# Patient Record
Sex: Female | Born: 1975 | Race: Black or African American | Hispanic: No | Smoking: Current every day smoker
Health system: Southern US, Community
[De-identification: ages and names within clinical notes are randomized; demographics above are authoritative.]

## PROBLEM LIST (undated history)

## (undated) DIAGNOSIS — F3130 Bipolar disorder, current episode depressed, mild or moderate severity, unspecified: Secondary | ICD-10-CM

## (undated) DIAGNOSIS — I10 Essential (primary) hypertension: Secondary | ICD-10-CM

## (undated) DIAGNOSIS — I890 Lymphedema, not elsewhere classified: Secondary | ICD-10-CM

## (undated) HISTORY — PX: GASTRIC BYPASS: SHX52

## (undated) HISTORY — PX: CHOLECYSTECTOMY: SHX55

---

## 2015-05-10 ENCOUNTER — Inpatient Hospital Stay (HOSPITAL_COMMUNITY)
Admission: EM | Admit: 2015-05-10 | Discharge: 2015-05-18 | DRG: 438 | Disposition: A | Discharge status: Home / Self Care | Payer: Medicaid - Out of State | Attending: Internal Medicine | Admitting: Internal Medicine

## 2015-05-10 ENCOUNTER — Encounter (HOSPITAL_COMMUNITY): Payer: Self-pay | Admitting: Emergency Medicine

## 2015-05-10 ENCOUNTER — Emergency Department (HOSPITAL_COMMUNITY): Payer: Medicaid - Out of State

## 2015-05-10 DIAGNOSIS — K859 Acute pancreatitis without necrosis or infection, unspecified: Secondary | ICD-10-CM | POA: Diagnosis present

## 2015-05-10 DIAGNOSIS — F1010 Alcohol abuse, uncomplicated: Secondary | ICD-10-CM | POA: Diagnosis present

## 2015-05-10 DIAGNOSIS — I10 Essential (primary) hypertension: Secondary | ICD-10-CM | POA: Diagnosis present

## 2015-05-10 DIAGNOSIS — F17210 Nicotine dependence, cigarettes, uncomplicated: Secondary | ICD-10-CM | POA: Diagnosis present

## 2015-05-10 DIAGNOSIS — I81 Portal vein thrombosis: Secondary | ICD-10-CM | POA: Diagnosis present

## 2015-05-10 DIAGNOSIS — Z9884 Bariatric surgery status: Secondary | ICD-10-CM

## 2015-05-10 DIAGNOSIS — F319 Bipolar disorder, unspecified: Secondary | ICD-10-CM | POA: Diagnosis present

## 2015-05-10 DIAGNOSIS — Z8249 Family history of ischemic heart disease and other diseases of the circulatory system: Secondary | ICD-10-CM

## 2015-05-10 DIAGNOSIS — K852 Alcohol induced acute pancreatitis without necrosis or infection: Secondary | ICD-10-CM | POA: Diagnosis present

## 2015-05-10 DIAGNOSIS — R609 Edema, unspecified: Secondary | ICD-10-CM | POA: Diagnosis not present

## 2015-05-10 DIAGNOSIS — R109 Unspecified abdominal pain: Secondary | ICD-10-CM | POA: Diagnosis present

## 2015-05-10 DIAGNOSIS — F3170 Bipolar disorder, currently in remission, most recent episode unspecified: Secondary | ICD-10-CM

## 2015-05-10 DIAGNOSIS — R1084 Generalized abdominal pain: Secondary | ICD-10-CM

## 2015-05-10 DIAGNOSIS — Z9049 Acquired absence of other specified parts of digestive tract: Secondary | ICD-10-CM | POA: Diagnosis not present

## 2015-05-10 DIAGNOSIS — E7439 Other disorders of intestinal carbohydrate absorption: Secondary | ICD-10-CM | POA: Diagnosis present

## 2015-05-10 DIAGNOSIS — R52 Pain, unspecified: Secondary | ICD-10-CM

## 2015-05-10 DIAGNOSIS — R739 Hyperglycemia, unspecified: Secondary | ICD-10-CM | POA: Diagnosis present

## 2015-05-10 HISTORY — DX: Lymphedema, not elsewhere classified: I89.0

## 2015-05-10 HISTORY — DX: Essential (primary) hypertension: I10

## 2015-05-10 HISTORY — DX: Bipolar disorder, current episode depressed, mild or moderate severity, unspecified: F31.30

## 2015-05-10 LAB — URINALYSIS, ROUTINE W REFLEX MICROSCOPIC
GLUCOSE, UA: NEGATIVE mg/dL
Ketones, ur: NEGATIVE mg/dL
Nitrite: NEGATIVE
PH: 5 (ref 5.0–8.0)
Protein, ur: 30 mg/dL — AB
SPECIFIC GRAVITY, URINE: 1.03 (ref 1.005–1.030)

## 2015-05-10 LAB — APTT: aPTT: 29 seconds (ref 24–37)

## 2015-05-10 LAB — COMPREHENSIVE METABOLIC PANEL
ALT: 36 U/L (ref 14–54)
AST: 62 U/L — AB (ref 15–41)
Albumin: 4.1 g/dL (ref 3.5–5.0)
Alkaline Phosphatase: 48 U/L (ref 38–126)
Anion gap: 11 (ref 5–15)
BUN: 9 mg/dL (ref 6–20)
CHLORIDE: 110 mmol/L (ref 101–111)
CO2: 20 mmol/L — AB (ref 22–32)
CREATININE: 0.64 mg/dL (ref 0.44–1.00)
Calcium: 8.5 mg/dL — ABNORMAL LOW (ref 8.9–10.3)
GFR calc Af Amer: >60 mL/min (ref >60)
GFR calc non Af Amer: >60 mL/min (ref >60)
GLUCOSE: 134 mg/dL — AB (ref 65–99)
Potassium: 3.8 mmol/L (ref 3.5–5.1)
SODIUM: 141 mmol/L (ref 135–145)
Total Bilirubin: 1.5 mg/dL — ABNORMAL HIGH (ref 0.3–1.2)
Total Protein: 7.4 g/dL (ref 6.5–8.1)

## 2015-05-10 LAB — CBC WITH DIFFERENTIAL/PLATELET
Basophils Absolute: 0 10*3/uL (ref 0.0–0.1)
Basophils Relative: 0 %
EOS ABS: 0 10*3/uL (ref 0.0–0.7)
EOS PCT: 0 %
HCT: 38 % (ref 36.0–46.0)
HEMOGLOBIN: 12.6 g/dL (ref 12.0–15.0)
LYMPHS PCT: 7 %
Lymphs Abs: 0.8 10*3/uL (ref 0.7–4.0)
MCH: 26.4 pg (ref 26.0–34.0)
MCHC: 33.2 g/dL (ref 30.0–36.0)
MCV: 79.5 fL (ref 78.0–100.0)
MONO ABS: 0.7 10*3/uL (ref 0.1–1.0)
Monocytes Relative: 6 %
NEUTROS PCT: 87 %
Neutro Abs: 9.5 10*3/uL — ABNORMAL HIGH (ref 1.7–7.7)
PLATELETS: 388 10*3/uL (ref 150–400)
RBC: 4.78 MIL/uL (ref 3.87–5.11)
RDW: 21.4 % — ABNORMAL HIGH (ref 11.5–15.5)
WBC: 11 10*3/uL — AB (ref 4.0–10.5)

## 2015-05-10 LAB — PROTIME-INR
INR: 1.28 (ref 0.00–1.49)
Prothrombin Time: 15.6 seconds — ABNORMAL HIGH (ref 11.6–15.2)

## 2015-05-10 LAB — URINE MICROSCOPIC-ADD ON

## 2015-05-10 LAB — LIPID PANEL
CHOL/HDL RATIO: 2 ratio
CHOLESTEROL: 128 mg/dL (ref 0–200)
HDL: 64 mg/dL (ref >40)
LDL Cholesterol: 40 mg/dL (ref 0–99)
TRIGLYCERIDES: 118 mg/dL (ref <150)
VLDL: 24 mg/dL (ref 0–40)

## 2015-05-10 LAB — LIPASE, BLOOD: Lipase: 801 U/L — ABNORMAL HIGH (ref 11–51)

## 2015-05-10 LAB — PREGNANCY, URINE: Preg Test, Ur: NEGATIVE

## 2015-05-10 MED ORDER — HYDROMORPHONE HCL 1 MG/ML IJ SOLN
1.0000 mg | INTRAMUSCULAR | Status: DC | PRN
  Administered 2015-05-10 – 2015-05-11 (×9): 1 mg via INTRAVENOUS
  Filled 2015-05-10 (×8): qty 1

## 2015-05-10 MED ORDER — ONDANSETRON HCL 4 MG/2ML IJ SOLN
4.0000 mg | Freq: Once | INTRAMUSCULAR | Status: AC
  Administered 2015-05-10: 4 mg via INTRAVENOUS
  Filled 2015-05-10: qty 2

## 2015-05-10 MED ORDER — HYDRALAZINE HCL 20 MG/ML IJ SOLN
5.0000 mg | INTRAMUSCULAR | Status: DC | PRN
  Administered 2015-05-11 (×2): 5 mg via INTRAVENOUS
  Filled 2015-05-10 (×2): qty 1

## 2015-05-10 MED ORDER — IOHEXOL 300 MG/ML  SOLN
50.0000 mL | Freq: Once | INTRAMUSCULAR | Status: AC | PRN
  Administered 2015-05-10: 50 mL via ORAL

## 2015-05-10 MED ORDER — SODIUM CHLORIDE 0.9 % IV BOLUS (SEPSIS)
1000.0000 mL | Freq: Once | INTRAVENOUS | Status: AC
  Administered 2015-05-10: 1000 mL via INTRAVENOUS

## 2015-05-10 MED ORDER — HEPARIN BOLUS VIA INFUSION
2500.0000 [IU] | Freq: Once | INTRAVENOUS | Status: DC
  Filled 2015-05-10: qty 2500

## 2015-05-10 MED ORDER — HYDROMORPHONE HCL 1 MG/ML IJ SOLN: 1.0000 mg | Freq: Once | INTRAMUSCULAR | Status: DC

## 2015-05-10 MED ORDER — OXYCODONE HCL 5 MG PO TABS
5.0000 mg | ORAL_TABLET | ORAL | Status: DC | PRN
  Administered 2015-05-11 – 2015-05-13 (×11): 5 mg via ORAL
  Filled 2015-05-10 (×11): qty 1

## 2015-05-10 MED ORDER — HYDROMORPHONE HCL 1 MG/ML IJ SOLN
1.0000 mg | Freq: Once | INTRAMUSCULAR | Status: AC
  Administered 2015-05-10: 1 mg via INTRAVENOUS
  Filled 2015-05-10: qty 1

## 2015-05-10 MED ORDER — ACETAMINOPHEN 650 MG RE SUPP: 650.0000 mg | Freq: Four times a day (QID) | RECTAL | Status: DC | PRN

## 2015-05-10 MED ORDER — ACETAMINOPHEN 325 MG PO TABS
650.0000 mg | ORAL_TABLET | Freq: Four times a day (QID) | ORAL | Status: DC | PRN
  Administered 2015-05-11 – 2015-05-15 (×6): 650 mg via ORAL
  Filled 2015-05-10 (×6): qty 2

## 2015-05-10 MED ORDER — IOPAMIDOL (ISOVUE-300) INJECTION 61%
100.0000 mL | Freq: Once | INTRAVENOUS | Status: AC | PRN
  Administered 2015-05-10: 100 mL via INTRAVENOUS

## 2015-05-10 MED ORDER — QUETIAPINE FUMARATE 50 MG PO TABS
75.0000 mg | ORAL_TABLET | Freq: Every day | ORAL | Status: DC
  Administered 2015-05-10 – 2015-05-17 (×8): 75 mg via ORAL
  Filled 2015-05-10 (×9): qty 1

## 2015-05-10 MED ORDER — HEPARIN (PORCINE) IN NACL 100-0.45 UNIT/ML-% IJ SOLN
1500.0000 [IU]/h | INTRAMUSCULAR | Status: DC
  Administered 2015-05-10 – 2015-05-11 (×2): 1500 [IU]/h via INTRAVENOUS
  Filled 2015-05-10 (×5): qty 250

## 2015-05-10 MED ORDER — SENNOSIDES-DOCUSATE SODIUM 8.6-50 MG PO TABS
1.0000 | ORAL_TABLET | Freq: Every evening | ORAL | Status: DC | PRN
  Administered 2015-05-11 – 2015-05-13 (×2): 1 via ORAL
  Filled 2015-05-10 (×2): qty 1

## 2015-05-10 MED ORDER — ONDANSETRON HCL 4 MG/2ML IJ SOLN
4.0000 mg | Freq: Four times a day (QID) | INTRAMUSCULAR | Status: DC | PRN
  Administered 2015-05-10: 4 mg via INTRAVENOUS
  Filled 2015-05-10: qty 2

## 2015-05-10 MED ORDER — PANTOPRAZOLE SODIUM 40 MG IV SOLR
40.0000 mg | Freq: Once | INTRAVENOUS | Status: AC
  Administered 2015-05-10: 40 mg via INTRAVENOUS
  Filled 2015-05-10: qty 40

## 2015-05-10 MED ORDER — LACTATED RINGERS IV SOLN
INTRAVENOUS | Status: DC
  Administered 2015-05-10 – 2015-05-15 (×8): via INTRAVENOUS

## 2015-05-10 MED ORDER — ONDANSETRON HCL 4 MG PO TABS: 4.0000 mg | ORAL_TABLET | Freq: Four times a day (QID) | ORAL | Status: DC | PRN

## 2015-05-10 MED ORDER — METOPROLOL TARTRATE 25 MG PO TABS
25.0000 mg | ORAL_TABLET | Freq: Two times a day (BID) | ORAL | Status: DC
  Administered 2015-05-10 – 2015-05-18 (×16): 25 mg via ORAL
  Filled 2015-05-10 (×18): qty 1

## 2015-05-10 NOTE — ED Notes (Signed)
Second attempt at calling report to floor. Secretary informed this Estate manager/land agentwriter nurse on floor is in contact room and will call back for report.

## 2015-05-10 NOTE — ED Notes (Addendum)
Attempted to call main phlebotomy to obtain lab draws. No answer from [id number], [phone], [phone] or [phone].

## 2015-05-10 NOTE — Progress Notes (Signed)
ANTICOAGULATION CONSULT NOTE - Initial Consult  Pharmacy Consult for heparin IV  Indication: Portal vein thrombosis  No Known Allergies  Patient Measurements: Height: 5\' 7"  (170.2 cm) Weight: 220 lb (99.791 kg) IBW/kg (Calculated) : 61.6 Heparin Dosing Weight: 84 kg  Vital Signs: Temp: 97.5 F (36.4 C) (03/27 1148) Temp Source: Oral (03/27 1148) BP: 156/87 mmHg (03/27 1403) Pulse Rate: 98 (03/27 1403)  Labs:  Recent Labs  05/10/15 1144  HGB 12.6  HCT 38.0  PLT 388  CREATININE 0.64    Estimated Creatinine Clearance: 113.5 mL/min (by C-G formula based on Cr of 0.64).   Medical History: Past Medical History  Diagnosis Date  . Hypertension   . Bipolar affect, depressed (HCC)   . Lymphedema     Assessment: [age] yoF presenting 3/27 with abdominal pain, found to have acute pancreatitis as well as partial portal vein thrombus.  Pharmacy to dose heparin.  PMH lymphedema, HTN, gastric bypass (2000)   Baseline INR, aPTT: wnl  Prior anticoagulation: none  Today, 05/10/2015:  CBC: wnl  No bleeding or infusion issues documented  CrCl: wnl  Goal of Therapy: Heparin level 0.3-0.7 units/ml Monitor platelets by anticoagulation protocol: Yes  Plan:  Heparin 2500 units IV bolus x 1  Heparin 1500 units/hr IV infusion  Check heparin level 6 hrs after start  Daily CBC, daily heparin level once stable  Monitor for signs of bleeding or thrombosis   Bernadene Personrew Challen Spainhour, PharmD Pager: [phone] 05/10/2015, 3:51 PM

## 2015-05-10 NOTE — ED Notes (Signed)
I attempted to get pt blood. Stick was unsuccessful. Fleet ContrasRachel in phlebotomy made aware.

## 2015-05-10 NOTE — ED Notes (Signed)
Bed: [id number] Expected date:  Expected time:  Means of arrival:  Comments: EMS- abdominal pain/nausea/Hx of gastric bypass

## 2015-05-10 NOTE — H&P (Signed)
History and PhysicalQuinlynn Lee   ZOX:[id number] DOB: 1975-02-23 DOA: 05/10/2015  Referring MD/provider: Dr. Estell Harpin PCP: No primary care provider on file.   Chief Complaint: Abdominal pain  History of Present Illness:   Molly Lee is an 40 y.o. female with a PMH of lymphedema of the lower extremities, hypertension and history of gastric bypass in 2000 and prior cholecystectomy who presents today with chief complaint of a 1-2 week history of intermittent abdominal pain that has gotten progressively worse over the past 2 days. She states that her pain has been almost constant now, but when it first started a week ago, she thought it was gas. She attempted to self treat with Tums, but had no significant relief. Her pain has increased in intensity to a 10+/10 and is described as "knifelike". She has had some associated nausea and retching, but no frank vomiting. Nothing has alleviated the pain however IV pain medicines given in the ED have eased it off some. Upon initial evaluation in the ED, her lipase was 801, and a CT of her abdomen and pelvis show moderate to severe acute pancreatitis with surrounding peripancreatic inflammatory changes and free fluid. No pseudocyst. There is also an acute partial thrombus of the main portal vein. The patient admits to binge drinking, up to a 12 pack of beer a day on the weekends. No history of alcohol withdrawal.  ROS:   Review of Systems  Constitutional: Positive for fever, chills and malaise/fatigue.  Eyes: Negative.   Respiratory: Positive for shortness of breath. Negative for cough.   Cardiovascular: Negative for chest pain.  Gastrointestinal: Positive for nausea, abdominal pain and constipation. Negative for heartburn, vomiting, diarrhea and blood in stool.  Genitourinary: Negative for dysuria.  Musculoskeletal: Positive for myalgias and joint pain.  Skin: Negative.   Neurological: Positive for dizziness and weakness. Negative  for headaches.  Endo/Heme/Allergies: Negative.   Psychiatric/Behavioral: Negative.      Past Medical History:   Past Medical History  Diagnosis Date  . Hypertension   . Bipolar affect, depressed (HCC)   . Lymphedema     Past Surgical History:   Past Surgical History  Procedure Laterality Date  . Gastric bypass      peformed year 2000  . Cesarean section      x 2  . Cholecystectomy      Social History:   Social History   Social History  . Marital Status: Legally Separated    Spouse Name: N/A  . Number of Children: 3  . Years of Education: N/A   Occupational History  . Unemployed.    Social History Main Topics  . Smoking status: Current Every Day Smoker -- 1.00 packs/day    Types: Cigarettes  . Smokeless tobacco: Never Used  . Alcohol Use: 0.0 oz/week    0 Standard drinks or equivalent per week     Comment: Binge drinker on weekends. Drinks up to a 12 pack.  . Drug Use: No  . Sexual Activity: Yes   Other Topics Concern  . Not on file   Social History Narrative   Separated.  [place] out [place] in [place].  Lives with her mother and 3 children.    Family history:   Family History  Problem Relation Age of Onset  . Hypertension Mother   . Hypertension Father     Allergies   Review of patient's allergies indicates no known allergies.  Current Medications:   Prior to Admission medications  Medication Sig Start Date End Date Taking? Authorizing Provider  ferrous sulfate 325 (65 FE) MG tablet Take 1 tablet by mouth 2 times daily 04/20/15  Yes Historical Provider, MD  furosemide (LASIX) 20 MG tablet Take 1 tablet by mouth once daily 05/03/15  Yes Historical Provider, MD  lisinopril (PRINIVIL,ZESTRIL) 20 MG tablet Take 1 tablet by mouth once daily 02/11/15  Yes Historical Provider, MD  metoprolol tartrate (LOPRESSOR) 25 MG tablet Take 1 tablet by mouth two times a day 02/04/15  Yes Historical Provider, MD  QUEtiapine (SEROQUEL) 25 MG tablet Take 3 tablets  by mouth at bedtime 05/03/15  Yes Historical Provider, MD    Physical Exam:   Filed Vitals:   05/10/15 1148 05/10/15 1315 05/10/15 1400 05/10/15 1403  BP: 157/105  156/87 156/87  Pulse: 90 91  98  Temp: 97.5 F (36.4 C)     TempSrc: Oral     Resp: Height:      Weight:      SpO2: 100% 99%  99%     Physical Exam: Blood pressure 156/87, pulse 98, temperature 97.5 F (36.4 C), temperature source Oral, resp. rate 21, height  (1.702 m), weight 99.791 kg (220 lb), last menstrual period 05/02/2015, SpO2 99 %. Gen: Moderate distress secondary to abdominal pain. Head: Normocephalic, atraumatic. Eyes: PERRL, EOMI, sclerae nonicteric. Mouth: Oropharynx reveals dry mucous membranes and white coating to tongue. Neck: Supple, no thyromegaly, no lymphadenopathy, no jugular venous distention. Chest: Lungs are diminished in the bases but otherwise clear. CV: Heart sounds are regular. No murmurs, rubs, or gallops. Abdomen: Softly distended and diffusely tender, normal active bowel sounds. Extremities: LLE more swollen than right, non-pitting edema. Skin: Warm and dry. Neuro: Alert and oriented times 3; grossly nonfocal.  Psych: Mood and affect anxious.   Data Review:    Labs: Basic Metabolic Panel:  Recent Labs Lab 05/10/15 1144  NA 141  K 3.8  CL 110  CO2 20*  GLUCOSE 134*  BUN 9  CREATININE 0.64  CALCIUM 8.5*   Liver Function Tests:  Recent Labs Lab 05/10/15 1144  AST 62*  ALT 36  ALKPHOS 48  BILITOT 1.5*  PROT 7.4  ALBUMIN 4.1    Recent Labs Lab 05/10/15 1144  LIPASE 801*   CBC:  Recent Labs Lab 05/10/15 1144  WBC 11.0*  NEUTROABS 9.5*  HGB 12.6  HCT 38.0  MCV 79.5  PLT 388    Radiographic Studies: Ct Abdomen Pelvis W Contrast  05/10/2015  CLINICAL DATA:  Epigastric abdominal pain with nausea. Prior gastric bypass and cholecystectomy. EXAM: CT ABDOMEN AND PELVIS WITH CONTRAST TECHNIQUE: Multidetector CT imaging of the abdomen and  pelvis was performed using the standard protocol following bolus administration of intravenous contrast. CONTRAST:  ISOVUE-300 IOPAMIDOL (ISOVUE-300) INJECTION 61% COMPARISON:  None. FINDINGS: Lower chest: Minor bibasilar atelectasis. No lower lobe pneumonia, collapse or consolidation. No pericardial or pleural effusion. Hepatobiliary: Prior cholecystectomy evident. No focal hepatic abnormality or biliary dilatation. Hypodense nonocclusive filling defect within the main portal vein compatible with portal vein thrombus. The portal venous outflow, right and left portal veins within the liver still remain patent. Hepatic veins also are patent. The mesenteric and splenic veins remain patent. Pancreas: Diffuse edema and inflammatory change throughout the retroperitoneum and surrounding the pancreas. Free fluid and edema throughout the central mesentery. Small amount abdominal free fluid along both pericolic gutters and extending into the pelvis. Findings are compatible with moderate to severe acute pancreatitis.  Despite this, the pancreas tissue continues to enhance without evidence necrosis. No focal fluid collection to suggest pseudocyst or abscess formation at this point. No ductal dilatation. Spleen: Within normal limits in size and appearance. Adrenals/Urinary Tract: No masses identified. No evidence of hydronephrosis. Stomach/Bowel: Prior gastric bypass evident. Mild nonspecific small bowel dilatation without significant obstruction pattern. No free air. Normal appendix demonstrated. Vascular/Lymphatic: Intact aorta. Negative for aneurysm or acute aortic abnormality. No dissection. No adenopathy demonstrated. Nonocclusive main portal vein thrombus has a blood secondary to the pancreatitis. Reproductive: Uterus and adnexa normal in size. Tubal ligation clips noted bilaterally. No fluid collection or abscess. Other: No inguinal abnormality or hernia.  Intact abdominal wall. Musculoskeletal: Diffuse degenerative  changes of the spine and SI joints. No acute osseous finding. Vacuum disc phenomenon at L5-S1. IMPRESSION: Moderate to severe acute pancreatitis with surrounding peripancreatic inflammatory changes and free fluid. There is also a central mesenteric edema and abdominal free fluid along the pericolic gutters extending into pelvis. No fluid collection, abscess or definite pseudocyst. Acute partial thrombosis of the main portal vein as above. These results were called by telephone at the time of interpretation on 05/10/2015 at 2:06 pm to Dr. Bethann Berkshire , who verbally acknowledged these results. Electronically Signed   By: Judie Petit.  Shick M.D.   On: 05/10/2015 14:07    EKG: Not ordered.   Assessment/Plan:   Principal Problem:   Acute alcoholic pancreatitis with abdominal pain - Imaging shows moderate-severe pancreatitis. Lipase is: 801.   - Differential includes gallstones, alcohol, trauma, medications, hypercalcemia and hypertriglyceridemia and rarely, IgG-4 related systemic disease.  Source of pancreatitis felt to be from: Binge drinking. - Disease severity is: Moderate-severe (Organ failure, local complications such as pancreatic fluid collections, pancreatic and peripancreatic necrosis, vascular thrombosis).  - Monitor BUN, hemoglobin, creatinine (elevations show hypovolemia). - Monitor WBC and fluid volume shifts (indicate inflammation). - Monitor creatinine, LFTs, and oxygenation status (indicates organ damage). - Monitor calcium (low calcium indicative of fat necrosis/saponification---end organ damage & hypovolemia). Calcium 8.5. - Management is supportive: IVF with Lactated Ringers at 5 cc/kg/h.  Lactated ringers decreases SIRS and organ failure risk by up to 80% compared to NS). - Monitor urine output.  Aim for > 0.5to 1 mL/kg/hour. - Bowel rest until pain improved.  Advance to low fat when able to tolerate. - Early enteral nutrition improves morbidity (infection) over total parenteral nutrition  (TPN) or nothing-by-mouth status (NPO). Nasojejunal feeding via a small-bore tube is preferred in patients who cannot tolerate oral intake, or gastric feeding via nasogastric tube provided there is not frequent emesis. - Monitor for intraabdominal compartment syndrome if aggressive IVF needed. - Monitor for complications: Glucose intolerance, DM, thrombosis of the portal vein, splenic aneurysm.  Active Problems:   Hypertension - Hold Lasix and ACE inhibitor as fluid volume shifts are likely to occur as pancreatitis progresses. - Continue metoprolol as blood pressure tolerates.    Hypocalcemia - Monitor closely. Likely indicative of fat necrosis/saponification.    Alcohol consumption binge drinking - Monitor for signs of withdrawal, although the patient admits only to binge drinking on weekends.    Portal vein thrombosis - We'll start on heparin. Will likely need to be switched to an oral agent and to continue therapy for 3-6 months.    Bipolar affective (HCC) - Continue Seroquel.    Hyperglycemia - Likely glucose intolerance induced from pancreatitis. Monitor CBGs and initiate insulin therapy if needed.    Left lower extremity asymmetric edema - Check Doppler  of left lower leg.    DVT prophylaxis - Therapeutic dose heparin ordered.  Code Status / Family Communication / Disposition Plan:   Code Status: Full. Family Communication: Carola RhineDonna Stuckey is emergency contact (mother). Disposition Plan: Home when stable, given severity of symptoms and pancreatic inflammation, will likely be here several days.  Time spent: 65 minutes.  RAMA,CHRISTINA [facility] Pager [phone] Cell: [phone]   If 7PM-7AM, please contact night-coverage [email or web address] Password [email or web address] 05/10/2015, 3:35 PM

## 2015-05-10 NOTE — ED Notes (Signed)
Per EMS, patient from home.  Patient is complaining of ULQ pain that radiates thru entire upper gastric area.  She is nausea but no vomiting. Patient has a history of hypertension.  EMS administered 4 mg of Zofran  BP:125/90 Hr:73 R:20 O2: 96% on room air

## 2015-05-10 NOTE — ED Provider Notes (Signed)
CSN: [id number]     Arrival date & time 05/10/15  0920 History   First MD Initiated Contact with Patient 05/10/15 [phone]     Chief Complaint  Patient presents with  . Abdominal Pain     (Consider location/radiation/quality/duration/timing/severity/associated sxs/prior Treatment) Patient is a 40 y.o. female presenting with abdominal pain. The history is provided by the patient (Patient complains of abdominal pain for 2 days).  Abdominal Pain Pain location:  Epigastric Pain quality: aching   Pain radiates to:  Does not radiate Pain severity:  Moderate Onset quality:  Sudden Timing:  Constant Progression:  Waxing and waning Chronicity:  New Context: not alcohol use   Associated symptoms: no chest pain, no cough, no diarrhea, no fatigue and no hematuria     Past Medical History  Diagnosis Date  . Hypertension    Past Surgical History  Procedure Laterality Date  . Gastric bypass      peformed year 2000   No family history on file. Social History  Substance Use Topics  . Smoking status: Current Every Day Smoker -- 1.00 packs/day    Types: Cigarettes  . Smokeless tobacco: None  . Alcohol Use: Yes     Comment: weekends   OB History    Gravida Para Term Preterm AB TAB SAB Ectopic Multiple Living   3              Review of Systems  Constitutional: Negative for appetite change and fatigue.  HENT: Negative for congestion, ear discharge and sinus pressure.   Eyes: Negative for discharge.  Respiratory: Negative for cough.   Cardiovascular: Negative for chest pain.  Gastrointestinal: Positive for abdominal pain. Negative for diarrhea.  Genitourinary: Negative for frequency and hematuria.  Musculoskeletal: Negative for back pain.  Skin: Negative for rash.  Neurological: Negative for seizures and headaches.  Psychiatric/Behavioral: Negative for hallucinations.      Allergies  Review of patient's allergies indicates no known allergies.  Home Medications   Prior to  Admission medications   Medication Sig Start Date End Date Taking? Authorizing Provider  ferrous sulfate 325 (65 FE) MG tablet Take 1 tablet by mouth 2 times daily 04/20/15  Yes Historical Provider, MD  furosemide (LASIX) 20 MG tablet Take 1 tablet by mouth once daily 05/03/15  Yes Historical Provider, MD  lisinopril (PRINIVIL,ZESTRIL) 20 MG tablet Take 1 tablet by mouth once daily 02/11/15  Yes Historical Provider, MD  metoprolol tartrate (LOPRESSOR) 25 MG tablet Take 1 tablet by mouth two times a day 02/04/15  Yes Historical Provider, MD  QUEtiapine (SEROQUEL) 25 MG tablet Take 3 tablets by mouth at bedtime 05/03/15  Yes Historical Provider, MD   BP 156/87 mmHg  Pulse 98  Temp(Src) 97.5 F (36.4 C) (Oral)  Resp 21  Ht  (1.702 m)  Wt 220 lb (99.791 kg)  BMI 34.45 kg/m2  SpO2 99%  LMP 05/02/2015 Physical Exam  Constitutional: She is oriented to person, place, and time. She appears well-developed.  HENT:  Head: Normocephalic.  Eyes: Conjunctivae and EOM are normal. No scleral icterus.  Neck: Neck supple. No thyromegaly present.  Cardiovascular: Normal rate and regular rhythm.  Exam reveals no gallop and no friction rub.   No murmur heard. Pulmonary/Chest: No stridor. She has no wheezes. She has no rales. She exhibits no tenderness.  Abdominal: She exhibits no distension. There is tenderness. There is no rebound.  Moderate periumbilical tenderness  Musculoskeletal: Normal range of motion. She exhibits no edema.  Lymphadenopathy:  She has no cervical adenopathy.  Neurological: She is oriented to person, place, and time. She exhibits normal muscle tone. Coordination normal.  Skin: No rash noted. No erythema.  Psychiatric: She has a normal mood and affect. Her behavior is normal.    ED Course  Procedures (including critical care time) Labs Review Labs Reviewed  CBC WITH DIFFERENTIAL/PLATELET - Abnormal; Notable for the following:    WBC 11.0 (*)    RDW 21.4 (*)    Neutro Abs  9.5 (*)    All other components within normal limits  COMPREHENSIVE METABOLIC PANEL - Abnormal; Notable for the following:    CO2 20 (*)    Glucose, Bld 134 (*)    Calcium 8.5 (*)    AST 62 (*)    Total Bilirubin 1.5 (*)    All other components within normal limits  LIPASE, BLOOD - Abnormal; Notable for the following:    Lipase 801 (*)    All other components within normal limits  URINALYSIS, ROUTINE W REFLEX MICROSCOPIC (NOT AT [facility]) - Abnormal; Notable for the following:    Color, Urine ORANGE (*)    APPearance TURBID (*)    Hgb urine dipstick MODERATE (*)    Bilirubin Urine SMALL (*)    Protein, ur 30 (*)    Leukocytes, UA TRACE (*)    All other components within normal limits  URINE MICROSCOPIC-ADD ON - Abnormal; Notable for the following:    Squamous Epithelial / LPF 6-30 (*)    Bacteria, UA MANY (*)    Casts GRANULAR CAST (*)    All other components within normal limits  PREGNANCY, URINE  I-STAT BETA HCG BLOOD, ED ([facility], [facility], [facility] ONLY)    Imaging Review Ct Abdomen Pelvis W Contrast  05/10/2015  CLINICAL DATA:  Epigastric abdominal pain with nausea. Prior gastric bypass and cholecystectomy. EXAM: CT ABDOMEN AND PELVIS WITH CONTRAST TECHNIQUE: Multidetector CT imaging of the abdomen and pelvis was performed using the standard protocol following bolus administration of intravenous contrast. CONTRAST:  100mL ISOVUE-300 IOPAMIDOL (ISOVUE-300) INJECTION 61% COMPARISON:  None. FINDINGS: Lower chest: Minor bibasilar atelectasis. No lower lobe pneumonia, collapse or consolidation. No pericardial or pleural effusion. Hepatobiliary: Prior cholecystectomy evident. No focal hepatic abnormality or biliary dilatation. Hypodense nonocclusive filling defect within the main portal vein compatible with portal vein thrombus. The portal venous outflow, right and left portal veins within the liver still remain patent. Hepatic veins also are patent. The mesenteric and splenic veins remain patent. Pancreas:  Diffuse edema and inflammatory change throughout the retroperitoneum and surrounding the pancreas. Free fluid and edema throughout the central mesentery. Small amount abdominal free fluid along both pericolic gutters and extending into the pelvis. Findings are compatible with moderate to severe acute pancreatitis. Despite this, the pancreas tissue continues to enhance without evidence necrosis. No focal fluid collection to suggest pseudocyst or abscess formation at this point. No ductal dilatation. Spleen: Within normal limits in size and appearance. Adrenals/Urinary Tract: No masses identified. No evidence of hydronephrosis. Stomach/Bowel: Prior gastric bypass evident. Mild nonspecific small bowel dilatation without significant obstruction pattern. No free air. Normal appendix demonstrated. Vascular/Lymphatic: Intact aorta. Negative for aneurysm or acute aortic abnormality. No dissection. No adenopathy demonstrated. Nonocclusive main portal vein thrombus has a blood secondary to the pancreatitis. Reproductive: Uterus and adnexa normal in size. Tubal ligation clips noted bilaterally. No fluid collection or abscess. Other: No inguinal abnormality or hernia.  Intact abdominal wall. Musculoskeletal: Diffuse degenerative changes of the spine and SI joints.  No acute osseous finding. Vacuum disc phenomenon at L5-S1. IMPRESSION: Moderate to severe acute pancreatitis with surrounding peripancreatic inflammatory changes and free fluid. There is also a central mesenteric edema and abdominal free fluid along the pericolic gutters extending into pelvis. No fluid collection, abscess or definite pseudocyst. Acute partial thrombosis of the main portal vein as above. These results were called by telephone at the time of interpretation on 05/10/2015 at 2:06 pm to Dr. Bethann Berkshire , who verbally acknowledged these results. Electronically Signed   By: Judie Petit.  Shick M.D.   On: 05/10/2015 14:07   I have personally reviewed and evaluated  these images and lab results as part of my medical decision-making.   EKG Interpretation None      MDM   Final diagnoses:  Abdominal pain in female    Admit for pancreatitis and throbosis    Bethann Berkshire, MD 05/10/15 1510

## 2015-05-10 NOTE — ED Notes (Signed)
Lab delayed.  Multiple phlebotomy sticks to obtain.  Obtained with RN ultrasound.

## 2015-05-10 NOTE — Progress Notes (Signed)
Pt states pt first name is Molly Lee, can not recall the "long last name" in [place]

## 2015-05-10 NOTE — Progress Notes (Addendum)
CM goggled doctors in [place] to find Dr Phylliss BobKnotresha Stewart Cm called the office [phone] to confirm this is her pcp and found out pt is scheduled for a 05/14/15 appt at [address]  [facility]  [address], [place], [place] [id number] [phone]  Entered in d/c instructions  dr Renaldo ReelKnotresha S Stewart Go on 05/14/2015 You have a scheduled 05/14/15 appointment on 05/14/15 at 2:30 pm [facility] [address], [place], [place] [id number] [phone]

## 2015-05-11 ENCOUNTER — Inpatient Hospital Stay (HOSPITAL_COMMUNITY): Payer: Medicaid - Out of State

## 2015-05-11 DIAGNOSIS — R609 Edema, unspecified: Secondary | ICD-10-CM

## 2015-05-11 LAB — BASIC METABOLIC PANEL
Anion gap: 7 (ref 5–15)
BUN: 7 mg/dL (ref 6–20)
CALCIUM: 8.4 mg/dL — AB (ref 8.9–10.3)
CHLORIDE: 108 mmol/L (ref 101–111)
CO2: 21 mmol/L — AB (ref 22–32)
CREATININE: 0.59 mg/dL (ref 0.44–1.00)
GFR calc Af Amer: >60 mL/min (ref >60)
GFR calc non Af Amer: >60 mL/min (ref >60)
Glucose, Bld: 129 mg/dL — ABNORMAL HIGH (ref 65–99)
Potassium: 3.8 mmol/L (ref 3.5–5.1)
Sodium: 136 mmol/L (ref 135–145)

## 2015-05-11 LAB — HEPARIN LEVEL (UNFRACTIONATED)
HEPARIN UNFRACTIONATED: 0.56 [IU]/mL (ref 0.30–0.70)
Heparin Unfractionated: 0.54 IU/mL (ref 0.30–0.70)

## 2015-05-11 LAB — GLUCOSE, CAPILLARY
GLUCOSE-CAPILLARY: 126 mg/dL — AB (ref 65–99)
GLUCOSE-CAPILLARY: 121 mg/dL — AB (ref 65–99)
Glucose-Capillary: 100 mg/dL — ABNORMAL HIGH (ref 65–99)
Glucose-Capillary: 94 mg/dL (ref 65–99)

## 2015-05-11 LAB — CBC
HCT: 36.4 % (ref 36.0–46.0)
HEMOGLOBIN: 12.1 g/dL (ref 12.0–15.0)
MCH: 26.8 pg (ref 26.0–34.0)
MCHC: 33.2 g/dL (ref 30.0–36.0)
MCV: 80.5 fL (ref 78.0–100.0)
PLATELETS: 341 10*3/uL (ref 150–400)
RBC: 4.52 MIL/uL (ref 3.87–5.11)
RDW: 21.7 % — ABNORMAL HIGH (ref 11.5–15.5)
WBC: 11.2 10*3/uL — ABNORMAL HIGH (ref 4.0–10.5)

## 2015-05-11 LAB — LIPASE, BLOOD: LIPASE: 601 U/L — AB (ref 11–51)

## 2015-05-11 MED ORDER — HYDROMORPHONE 1 MG/ML IV SOLN
INTRAVENOUS | Status: DC
  Administered 2015-05-11: 6.8 mg via INTRAVENOUS
  Administered 2015-05-11: 11:00:00 via INTRAVENOUS
  Administered 2015-05-11: 1.1 mg via INTRAVENOUS
  Administered 2015-05-12: 7.5 mg via INTRAVENOUS
  Administered 2015-05-12: 11:00:00 via INTRAVENOUS
  Administered 2015-05-12: 7.4 mg via INTRAVENOUS
  Administered 2015-05-12: 1 mg via INTRAVENOUS
  Administered 2015-05-13: 8.4 mg via INTRAVENOUS
  Filled 2015-05-11 (×2): qty 25

## 2015-05-11 MED ORDER — SODIUM CHLORIDE 0.9% FLUSH: 9.0000 mL | INTRAVENOUS | Status: DC | PRN

## 2015-05-11 MED ORDER — NALOXONE HCL 0.4 MG/ML IJ SOLN: 0.4000 mg | INTRAMUSCULAR | Status: DC | PRN

## 2015-05-11 MED ORDER — HYDROXYZINE HCL 50 MG/ML IM SOLN
25.0000 mg | Freq: Once | INTRAMUSCULAR | Status: DC
  Filled 2015-05-11: qty 0.5

## 2015-05-11 MED ORDER — DIPHENHYDRAMINE HCL 12.5 MG/5ML PO ELIX
12.5000 mg | ORAL_SOLUTION | Freq: Four times a day (QID) | ORAL | Status: DC | PRN
  Administered 2015-05-13: 12.5 mg via ORAL
  Filled 2015-05-11: qty 5

## 2015-05-11 MED ORDER — DIPHENHYDRAMINE HCL 50 MG/ML IJ SOLN
12.5000 mg | Freq: Four times a day (QID) | INTRAMUSCULAR | Status: DC | PRN
  Administered 2015-05-11 – 2015-05-12 (×5): 12.5 mg via INTRAVENOUS
  Filled 2015-05-11 (×5): qty 1

## 2015-05-11 MED ORDER — ONDANSETRON HCL 4 MG/2ML IJ SOLN
4.0000 mg | Freq: Four times a day (QID) | INTRAMUSCULAR | Status: DC | PRN
  Administered 2015-05-11: 4 mg via INTRAVENOUS
  Filled 2015-05-11: qty 2

## 2015-05-11 MED ORDER — ZOLPIDEM TARTRATE 5 MG PO TABS
5.0000 mg | ORAL_TABLET | Freq: Every evening | ORAL | Status: DC | PRN
  Administered 2015-05-11 – 2015-05-16 (×4): 5 mg via ORAL
  Filled 2015-05-11 (×4): qty 1

## 2015-05-11 MED ORDER — NICOTINE 14 MG/24HR TD PT24
14.0000 mg | MEDICATED_PATCH | Freq: Every day | TRANSDERMAL | Status: DC
  Administered 2015-05-11 – 2015-05-17 (×7): 14 mg via TRANSDERMAL
  Filled 2015-05-11 (×8): qty 1

## 2015-05-11 NOTE — Progress Notes (Signed)
Progress Note   Molly Lee [id number] DOB: 10/29/1975 DOA: 05/10/2015 PCP: PROVIDER NOT IN SYSTEM   Brief Narrative:   Molly Lee is an 40 y.o. female with a PMH of binge drinking, lymphedema of the lower extremities, hypertension and history of gastric bypass in 2000 and prior cholecystectomy who was admitted 05/10/15 with acute alcoholic pancreatitis.  Upon initial evaluation in the ED, her lipase was 801, and a CT of her abdomen and pelvis showed moderate to severe acute pancreatitis with surrounding peripancreatic inflammatory changes and free fluid. No pseudocyst. There was also an acute partial thrombus of the main portal vein. The patient admitted to binge drinking, up to a 12 pack of beer a day on the weekends. No history of alcohol withdrawal.  Assessment/Plan:   Principal Problem:  Acute alcoholic pancreatitis with abdominal pain - Imaging shows moderate-severe pancreatitis. Lipase is: 801---> 601.  - Source of pancreatitis felt to be from: Binge drinking. Triglycerides 118. - Disease severity is: Moderate-severe given portal vein thrombosis.  - Monitor BUN, hemoglobin, creatinine to evaluate for hypovolemia, currently stable. - Monitor WBC and fluid volume shifts to evaluate for inflammation, WBC stable.  - Slightly hypocalcemic, worrisome for pancreatic necrosis and saponification. - Continue supportive care with lactated Ringer's. Lactated ringers decreases SIRS and organ failure risk by up to 80% compared to NS. - Bowel rest until pain improved. Advance to low fat when able to tolerate. - Early enteral nutrition improves morbidity (infection) over total parenteral nutrition (TPN) or nothing-by-mouth status (NPO). Nasojejunal feeding via a small-bore tube is preferred in patients who cannot tolerate oral intake, or gastric feeding via nasogastric tube provided there is not frequent emesis. - Monitor for intraabdominal compartment syndrome if aggressive  IVF needed. - Monitor for complications: Glucose intolerance, DM, thrombosis of the portal vein, splenic aneurysm.  Active Problems:  Hypertension - Hold Lasix and ACE inhibitor as fluid volume shifts are likely to occur as pancreatitis progresses. - Continue metoprolol as blood pressure tolerates.   Hypocalcemia - Monitor closely. Likely indicative of fat necrosis/saponification.   Alcohol consumption binge drinking - Monitor for signs of withdrawal, although the patient admits only to binge drinking on weekends.   Portal vein thrombosis - Continue IV heparin. Will likely need to be switched to an oral agent and to continue therapy for 3-6 months.   Bipolar affective (HCC) - Continue Seroquel.   Hyperglycemia - Likely glucose intolerance induced from pancreatitis. Monitor CBGs and initiate insulin therapy if needed.   Left lower extremity asymmetric edema - Follow-up Doppler of left lower leg.   DVT prophylaxis - Therapeutic dose heparin ordered.   Family Communication/Anticipated D/C date and plan/Code Status   Family Communication: No family currently at the bedside.  Mother updated by telephone with patient's permission. Disposition Plan/date: Home when stable, likely will need several more days in the hospital. Code Status: Full code.   IV Access:    Peripheral IV   Procedures and diagnostic studies:   Ct Abdomen Pelvis W Contrast  05/10/2015  CLINICAL DATA:  Epigastric abdominal pain with nausea. Prior gastric bypass and cholecystectomy. EXAM: CT ABDOMEN AND PELVIS WITH CONTRAST TECHNIQUE: Multidetector CT imaging of the abdomen and pelvis was performed using the standard protocol following bolus administration of intravenous contrast. CONTRAST:  100mL ISOVUE-300 IOPAMIDOL (ISOVUE-300) INJECTION 61% COMPARISON:  None. FINDINGS: Lower chest: Minor bibasilar atelectasis. No lower lobe pneumonia, collapse or consolidation. No pericardial or pleural effusion.  Hepatobiliary: Prior cholecystectomy evident. No focal hepatic  abnormality or biliary dilatation. Hypodense nonocclusive filling defect within the main portal vein compatible with portal vein thrombus. The portal venous outflow, right and left portal veins within the liver still remain patent. Hepatic veins also are patent. The mesenteric and splenic veins remain patent. Pancreas: Diffuse edema and inflammatory change throughout the retroperitoneum and surrounding the pancreas. Free fluid and edema throughout the central mesentery. Small amount abdominal free fluid along both pericolic gutters and extending into the pelvis. Findings are compatible with moderate to severe acute pancreatitis. Despite this, the pancreas tissue continues to enhance without evidence necrosis. No focal fluid collection to suggest pseudocyst or abscess formation at this point. No ductal dilatation. Spleen: Within normal limits in size and appearance. Adrenals/Urinary Tract: No masses identified. No evidence of hydronephrosis. Stomach/Bowel: Prior gastric bypass evident. Mild nonspecific small bowel dilatation without significant obstruction pattern. No free air. Normal appendix demonstrated. Vascular/Lymphatic: Intact aorta. Negative for aneurysm or acute aortic abnormality. No dissection. No adenopathy demonstrated. Nonocclusive main portal vein thrombus has a blood secondary to the pancreatitis. Reproductive: Uterus and adnexa normal in size. Tubal ligation clips noted bilaterally. No fluid collection or abscess. Other: No inguinal abnormality or hernia.  Intact abdominal wall. Musculoskeletal: Diffuse degenerative changes of the spine and SI joints. No acute osseous finding. Vacuum disc phenomenon at L5-S1. IMPRESSION: Moderate to severe acute pancreatitis with surrounding peripancreatic inflammatory changes and free fluid. There is also a central mesenteric edema and abdominal free fluid along the pericolic gutters extending into  pelvis. No fluid collection, abscess or definite pseudocyst. Acute partial thrombosis of the main portal vein as above. These results were called by telephone at the time of interpretation on 05/10/2015 at 2:06 pm to Dr. Bethann Berkshire , who verbally acknowledged these results. Electronically Signed   By: Judie Petit.  Shick M.D.   On: 05/10/2015 14:07     Medical Consultants:    None.  Anti-Infectives:   Anti-infectives    None      Subjective:   Molly Lee is still having severe abdominal pain, unrelieved at the current dose of Dilaudid 1 mg Q 2 hours.  She is tearful.  No nausea or vomiting over night.  Unable to sleep.  Objective:    Filed Vitals:   05/10/15 1601 05/10/15 1700 05/10/15 2121 05/11/15 0539  BP: 158/94 166/100 158/104 154/104  Pulse: 85 105 106 104  Temp:  97.6 F (36.4 C) 97.8 F (36.6 C) 98.3 F (36.8 C)  TempSrc:  Oral Oral Oral  Resp: Height:      Weight:      SpO2: 99% 99% 96% 93%   No intake or output data in the 24 hours ending 05/11/15 0914 Filed Weights   05/10/15 0934  Weight: 99.791 kg (220 lb)    Exam: Gen:  Tearful. Cardiovascular:  Tachy, No M/R/G Respiratory:  Lungs CTAB  Gastrointestinal:  Abdomen slightly distended, tender diffusely Extremities:  No C/E/C   Data Reviewed:    Labs: Basic Metabolic Panel:  Recent Labs Lab 05/10/15 1144 05/11/15 0150  NA 141 136  K 3.8 3.8  CL 110 108  CO2 20* 21*  GLUCOSE 134* 129*  BUN 9 7  CREATININE 0.64 0.59  CALCIUM 8.5* 8.4*   GFR Estimated Creatinine Clearance: 113.5 mL/min (by C-G formula based on Cr of 0.59). Liver Function Tests:  Recent Labs Lab 05/10/15 1144  AST 62*  ALT 36  ALKPHOS 48  BILITOT 1.5*  PROT 7.4  ALBUMIN 4.1  Recent Labs Lab 05/10/15 1144 05/11/15 0150  LIPASE 801* 601*   Coagulation profile  Recent Labs Lab 05/10/15 1845  INR 1.28    CBC:  Recent Labs Lab 05/10/15 1144 05/11/15 0150  WBC 11.0* 11.2*    NEUTROABS 9.5*  --   HGB 12.6 12.1  HCT 38.0 36.4  MCV 79.5 80.5  PLT 388 341   CBG:  Recent Labs Lab 05/11/15 0113 05/11/15 0554  GLUCAP 126* 121*   Lipid Profile:  Recent Labs  05/10/15 1845  CHOL 128  HDL 64  LDLCALC 40  TRIG 118  CHOLHDL 2.0     Medications:   . heparin  2,500 Units Intravenous Once  . metoprolol tartrate  25 mg Oral BID  . QUEtiapine  75 mg Oral QHS   Continuous Infusions: . heparin 1,500 Units/hr (05/10/15 1952)  . lactated ringers 125 mL/hr at 05/11/15 0344    Time spent: 35 minutes with > 50% of time discussing current diagnostic test results, clinical impression and plan of care.   LOS: 1 day   RAMA,CHRISTINA  [facility] Pager [phone]. If unable to reach me by pager, please call my cell phone at [phone].  *Please refer to [email or web address], password [email or web address] to get updated schedule on who will round on this patient, as hospitalists switch teams weekly. If 7PM-7AM, please contact night-coverage at [email or web address], password [email or web address] for any overnight needs.  05/11/2015, 9:14 AM

## 2015-05-11 NOTE — Progress Notes (Signed)
ANTICOAGULATION CONSULT NOTE - Follow Up Consult  Pharmacy Consult for heparin IV  Indication: Portal vein thrombosis  No Known Allergies  Patient Measurements: Height: 5\' 7"  (170.2 cm) Weight: 220 lb (99.791 kg) IBW/kg (Calculated) : 61.6 Heparin Dosing Weight: 84 kg  Vital Signs: Temp: 98.3 F (36.8 C) (03/28 0539) Temp Source: Oral (03/28 0539) BP: 154/104 mmHg (03/28 0539) Pulse Rate: 104 (03/28 0539)  Labs:  Recent Labs  05/10/15 1144 05/10/15 1845 05/11/15 0150 05/11/15 0814  HGB 12.6  --  12.1  --   HCT 38.0  --  36.4  --   PLT 388  --  341  --   APTT  --  29  --   --   LABPROT  --  15.6*  --   --   INR  --  1.28  --   --   HEPARINUNFRC  --   --  0.56 0.54  CREATININE 0.64  --  0.59  --     Estimated Creatinine Clearance: 113.5 mL/min (by C-G formula based on Cr of 0.59).   Medical History: Past Medical History  Diagnosis Date  . Hypertension   . Bipolar affect, depressed (HCC)   . Lymphedema     Assessment: [age] yoF presenting 3/27 with abdominal pain, found to have acute pancreatitis as well as partial portal vein thrombus.  Pharmacy to dose heparin.  PMH lymphedema, HTN, gastric bypass (2000)   Baseline INR, aPTT: wnl  Prior anticoagulation: none  Today, 05/11/2015:  Heparin level therapeutic (0.54), second therapeutic level with infusion at 1500 units/hr  CBC: wnl  No bleeding or infusion issues documented  CrCl: wnl  Goal of Therapy: Heparin level 0.3-0.7 units/ml Monitor platelets by anticoagulation protocol: Yes  Plan:  Continue heparin infusion at 1500 units/hr   Daily CBC, daily heparin level  Clance BollAmanda Ladeja Pelham, PharmD, BCPS Pager: [phone] 05/11/2015 9:17 AM

## 2015-05-11 NOTE — Progress Notes (Signed)
VASCULAR LAB PRELIMINARY  PRELIMINARY  PRELIMINARY  PRELIMINARY  Left lower extremity venous duplex completed.     Left:  No evidence of DVT, superficial thrombosis, or Baker's cyst.  Jenetta Logesami Raniah Karan, RVT, RDMS 05/11/2015, 11:12 AM

## 2015-05-11 NOTE — Progress Notes (Signed)
ANTICOAGULATION CONSULT NOTE - Follow Up Consult  Pharmacy Consult for heparin Indication: Portal vein thrombosis  No Known Allergies  Patient Measurements: Height: 5\' 7"  (170.2 cm) Weight: 220 lb (99.791 kg) IBW/kg (Calculated) : 61.6 Heparin Dosing Weight:   Vital Signs: Temp: 97.8 F (36.6 C) (03/27 2121) Temp Source: Oral (03/27 2121) BP: 158/104 mmHg (03/27 2121) Pulse Rate: 106 (03/27 2121)  Labs:  Recent Labs  05/10/15 1144 05/10/15 1845 05/11/15 0150  HGB 12.6  --  12.1  HCT 38.0  --  36.4  PLT 388  --  341  APTT  --  29  --   LABPROT  --  15.6*  --   INR  --  1.28  --   HEPARINUNFRC  --   --  0.56  CREATININE 0.64  --  0.59    Estimated Creatinine Clearance: 113.5 mL/min (by C-G formula based on Cr of 0.59).   Medications:  Infusions:  . heparin 1,500 Units/hr (05/10/15 1952)  . lactated ringers 125 mL/hr at 05/11/15 0344    Assessment: Patient with heparin level at goal.  No heparin issues noted.  Goal of Therapy:  Heparin level 0.3-0.7 units/ml Monitor platelets by anticoagulation protocol: Yes   Plan:  Continue heparin drip at current rate Recheck level at 0800  Darlina GuysGrimsley Jr, Wolf LakeJulian Crowford 05/11/2015,5:34 AM

## 2015-05-12 LAB — COMPREHENSIVE METABOLIC PANEL
ALBUMIN: 3.2 g/dL — AB (ref 3.5–5.0)
ALT: 20 U/L (ref 14–54)
ANION GAP: 7 (ref 5–15)
AST: 25 U/L (ref 15–41)
Alkaline Phosphatase: 43 U/L (ref 38–126)
BILIRUBIN TOTAL: 0.7 mg/dL (ref 0.3–1.2)
BUN: 5 mg/dL — ABNORMAL LOW (ref 6–20)
CO2: 22 mmol/L (ref 22–32)
Calcium: 8 mg/dL — ABNORMAL LOW (ref 8.9–10.3)
Chloride: 102 mmol/L (ref 101–111)
Creatinine, Ser: 0.52 mg/dL (ref 0.44–1.00)
GFR calc Af Amer: >60 mL/min (ref >60)
GFR calc non Af Amer: >60 mL/min (ref >60)
GLUCOSE: 83 mg/dL (ref 65–99)
POTASSIUM: 3.1 mmol/L — AB (ref 3.5–5.1)
Sodium: 131 mmol/L — ABNORMAL LOW (ref 135–145)
TOTAL PROTEIN: 6.4 g/dL — AB (ref 6.5–8.1)

## 2015-05-12 LAB — GLUCOSE, CAPILLARY
GLUCOSE-CAPILLARY: 96 mg/dL (ref 65–99)
Glucose-Capillary: 67 mg/dL (ref 65–99)
Glucose-Capillary: 68 mg/dL (ref 65–99)
Glucose-Capillary: 80 mg/dL (ref 65–99)

## 2015-05-12 LAB — CBC
HCT: 32.9 % — ABNORMAL LOW (ref 36.0–46.0)
Hemoglobin: 11.1 g/dL — ABNORMAL LOW (ref 12.0–15.0)
MCH: 27.3 pg (ref 26.0–34.0)
MCHC: 33.7 g/dL (ref 30.0–36.0)
MCV: 80.8 fL (ref 78.0–100.0)
PLATELETS: 240 10*3/uL (ref 150–400)
RBC: 4.07 MIL/uL (ref 3.87–5.11)
RDW: 22.2 % — AB (ref 11.5–15.5)
WBC: 11.9 10*3/uL — AB (ref 4.0–10.5)

## 2015-05-12 LAB — LIPASE, BLOOD: LIPASE: 323 U/L — AB (ref 11–51)

## 2015-05-12 LAB — HEPARIN LEVEL (UNFRACTIONATED): HEPARIN UNFRACTIONATED: 0.55 [IU]/mL (ref 0.30–0.70)

## 2015-05-12 MED ORDER — POTASSIUM CHLORIDE CRYS ER 20 MEQ PO TBCR
40.0000 meq | EXTENDED_RELEASE_TABLET | Freq: Four times a day (QID) | ORAL | Status: AC
  Administered 2015-05-12 (×2): 40 meq via ORAL
  Filled 2015-05-12 (×2): qty 2

## 2015-05-12 NOTE — Progress Notes (Signed)
ANTICOAGULATION CONSULT NOTE - Follow Up Consult  Pharmacy Consult for heparin IV  Indication: Portal vein thrombosis  No Known Allergies  Patient Measurements: Height: 5\' 7"  (170.2 cm) Weight: 220 lb (99.791 kg) IBW/kg (Calculated) : 61.6 Heparin Dosing Weight: 84 kg  Vital Signs: Temp: 98.7 F (37.1 C) (03/29 0624) Temp Source: Oral (03/29 0624) BP: 122/85 mmHg (03/29 0624) Pulse Rate: 116 (03/29 0624)  Labs:  Recent Labs  05/10/15 1144 05/10/15 1845 05/11/15 0150 05/11/15 0814 05/12/15 0520  HGB 12.6  --  12.1  --  11.1*  HCT 38.0  --  36.4  --  32.9*  PLT 388  --  341  --  240  APTT  --  29  --   --   --   LABPROT  --  15.6*  --   --   --   INR  --  1.28  --   --   --   HEPARINUNFRC  --   --  0.56 0.54 0.55  CREATININE 0.64  --  0.59  --  0.52    Estimated Creatinine Clearance: 113.5 mL/min (by C-G formula based on Cr of 0.52).   Medical History: Past Medical History  Diagnosis Date  . Hypertension   . Bipolar affect, depressed (HCC)   . Lymphedema     Assessment: [age] yoF presenting 3/27 with abdominal pain, found to have acute pancreatitis as well as partial portal vein thrombus.  Pharmacy to dose heparin.  PMH lymphedema, HTN, gastric bypass (2000). Dopplers of left lower extremity negative for DVT.   Baseline INR, aPTT: wnl  Prior anticoagulation: none  Today, 05/12/2015:  Heparin level therapeutic (0.55), second therapeutic level with infusion at 1500 units/hr  CBC ok  No bleeding or infusion issues documented  CrCl: wnl  Goal of Therapy: Heparin level 0.3-0.7 units/ml Monitor platelets by anticoagulation protocol: Yes  Plan:  Continue heparin infusion at 1500 units/hr   Daily CBC, daily heparin level  F/u plans for transition to oral anticoagulant.  Clance BollAmanda Krystin Keeven, PharmD, BCPS Pager: [phone] 05/12/2015 6:59 AM

## 2015-05-12 NOTE — Progress Notes (Signed)
Progress Note   Miller Edgington ZOX:[id number] DOB: 06/23/1975 DOA: 05/10/2015 PCP: PROVIDER NOT IN SYSTEM   Brief Narrative:   Molly Lee is an 40 y.o. female with a PMH of binge drinking, lymphedema of the lower extremities, hypertension and history of gastric bypass in 2000 and prior cholecystectomy who was admitted 05/10/15 with acute alcoholic pancreatitis.  Upon initial evaluation in the ED, her lipase was 801, and a CT of her abdomen and pelvis showed moderate to severe acute pancreatitis with surrounding peripancreatic inflammatory changes and free fluid. No pseudocyst. There was also an acute partial thrombus of the main portal vein. The patient admitted to binge drinking, up to a 12 pack of beer a day on the weekends. No history of alcohol withdrawal.  Assessment/Plan:    Acute alcoholic pancreatitis - Imaging shows moderate-severe pancreatitis. Lipase is: 801---> 601.  - Source of pancreatitis felt to be from: Binge drinking. Triglycerides 118. - Disease severity is: Moderate-severe given portal vein thrombosis.  - Consider early enteral nutrition over (TPN). - Monitor for complications: Glucose intolerance, DM, splenic aneurysm or fever.   Portal vein thrombosis - Continue IV heparin. Will likely need to be switched to an oral agent and to continue therapy for 3-6 months.   Hypertension - Hold Lasix and ACE inhibitor as fluid volume shifts are likely to occur as pancreatitis progresses. - Continue metoprolol as blood pressure tolerates.   Hypocalcemia - Monitor closely. Likely indicative of fat necrosis/saponification.   Alcohol consumption binge drinking - Monitor for signs of withdrawal, although the patient admits only to binge drinking on weekends.   Bipolar affective (HCC) - Continue Seroquel.   Hyperglycemia - Likely glucose intolerance induced from pancreatitis. Monitor CBGs and initiate insulin therapy if needed.   Left lower extremity  asymmetric edema - No DVT on Doppler ultrasound.   DVT prophylaxis - Therapeutic dose heparin ordered.   Family Communication/Anticipated D/C date and plan/Code Status   Family Communication: No family currently at the bedside.  Mother updated by telephone with patient's permission. Disposition Plan/date: Home when stable, likely will need several more days in the hospital. Code Status: Full code.   IV Access:    Peripheral IV   Procedures and diagnostic studies:   Ct Abdomen Pelvis W Contrast  05/10/2015  CLINICAL DATA:  Epigastric abdominal pain with nausea. Prior gastric bypass and cholecystectomy. EXAM: CT ABDOMEN AND PELVIS WITH CONTRAST TECHNIQUE: Multidetector CT imaging of the abdomen and pelvis was performed using the standard protocol following bolus administration of intravenous contrast. CONTRAST:  ISOVUE-300 IOPAMIDOL (ISOVUE-300) INJECTION 61% COMPARISON:  None. FINDINGS: Lower chest: Minor bibasilar atelectasis. No lower lobe pneumonia, collapse or consolidation. No pericardial or pleural effusion. Hepatobiliary: Prior cholecystectomy evident. No focal hepatic abnormality or biliary dilatation. Hypodense nonocclusive filling defect within the main portal vein compatible with portal vein thrombus. The portal venous outflow, right and left portal veins within the liver still remain patent. Hepatic veins also are patent. The mesenteric and splenic veins remain patent. Pancreas: Diffuse edema and inflammatory change throughout the retroperitoneum and surrounding the pancreas. Free fluid and edema throughout the central mesentery. Small amount abdominal free fluid along both pericolic gutters and extending into the pelvis. Findings are compatible with moderate to severe acute pancreatitis. Despite this, the pancreas tissue continues to enhance without evidence necrosis. No focal fluid collection to suggest pseudocyst or abscess formation at this point. No ductal dilatation.  Spleen: Within normal limits in size and appearance. Adrenals/Urinary Tract: No  masses identified. No evidence of hydronephrosis. Stomach/Bowel: Prior gastric bypass evident. Mild nonspecific small bowel dilatation without significant obstruction pattern. No free air. Normal appendix demonstrated. Vascular/Lymphatic: Intact aorta. Negative for aneurysm or acute aortic abnormality. No dissection. No adenopathy demonstrated. Nonocclusive main portal vein thrombus has a blood secondary to the pancreatitis. Reproductive: Uterus and adnexa normal in size. Tubal ligation clips noted bilaterally. No fluid collection or abscess. Other: No inguinal abnormality or hernia.  Intact abdominal wall. Musculoskeletal: Diffuse degenerative changes of the spine and SI joints. No acute osseous finding. Vacuum disc phenomenon at L5-S1. IMPRESSION: Moderate to severe acute pancreatitis with surrounding peripancreatic inflammatory changes and free fluid. There is also a central mesenteric edema and abdominal free fluid along the pericolic gutters extending into pelvis. No fluid collection, abscess or definite pseudocyst. Acute partial thrombosis of the main portal vein as above. These results were called by telephone at the time of interpretation on 05/10/2015 at 2:06 pm to Dr. Bethann Berkshire , who verbally acknowledged these results. Electronically Signed   By: Judie Petit.  Shick M.D.   On: 05/10/2015 14:07     Medical Consultants:    None.  Anti-Infectives:   Anti-infectives    None      Subjective:   Molly Lee is still having severe abdominal pain, unrelieved at the current dose of Dilaudid 1 mg Q 2 hours.  She is tearful.  No nausea or vomiting over night.  Unable to sleep.  Objective:    Filed Vitals:   05/11/15 2333 05/12/15 0332 05/12/15 0624 05/12/15 1420  BP:   122/85 126/75  Pulse:   116 99  Temp:   98.7 F (37.1 C) 98.1 F (36.7 C)  TempSrc:   Oral Oral  Resp: Height:      Weight:       SpO2: 95%  94% 100%    Intake/Output Summary (Last 24 hours) at 05/12/15 1647 Last data filed at 05/12/15 1016  Gross per 24 hour  Intake      0 ml  Output      0 ml  Net      0 ml   Filed Weights   05/10/15 0934  Weight: 99.791 kg (220 lb)    Exam: Gen:  Tearful. Cardiovascular:  Tachy, No M/R/G Respiratory:  Lungs CTAB  Gastrointestinal:  Abdomen slightly distended, tender diffusely Extremities:  No C/E/C   Data Reviewed:    Labs: Basic Metabolic Panel:  Recent Labs Lab 05/10/15 1144 05/11/15 0150 05/12/15 0520  NA 141 136 131*  K 3.8 3.8 3.1*  CL 110 108 102  CO2 20* 21* 22  GLUCOSE 134* 129* 83  BUN 9 7 5*  CREATININE 0.64 0.59 0.52  CALCIUM 8.5* 8.4* 8.0*   GFR Estimated Creatinine Clearance: 113.5 mL/min (by C-G formula based on Cr of 0.52). Liver Function Tests:  Recent Labs Lab 05/10/15 1144 05/12/15 0520  AST 62* 25  ALT 36 20  ALKPHOS 48 43  BILITOT 1.5* 0.7  PROT 7.4 6.4*  ALBUMIN 4.1 3.2*    Recent Labs Lab 05/10/15 1144 05/11/15 0150 05/12/15 0520  LIPASE 801* 601* 323*   Coagulation profile  Recent Labs Lab 05/10/15 1845  INR 1.28    CBC:  Recent Labs Lab 05/10/15 1144 05/11/15 0150 05/12/15 0520  WBC 11.0* 11.2* 11.9*  NEUTROABS 9.5*  --   --   HGB 12.6 12.1 11.1*  HCT 38.0 36.4 32.9*  MCV 79.5 80.5 80.8  PLT  388 341 240   CBG:  Recent Labs Lab 05/11/15 0554 05/11/15 1144 05/11/15 1822 05/12/15 0004 05/12/15 0617  GLUCAP 121* 100* 94 80 96   Lipid Profile:  Recent Labs  05/10/15 1845  CHOL 128  HDL 64  LDLCALC 40  TRIG 118  CHOLHDL 2.0     Medications:   . heparin  2,500 Units Intravenous Once  . HYDROmorphone   Intravenous 6 times per day  . hydrOXYzine  25 mg Intramuscular Once  . metoprolol tartrate  25 mg Oral BID  . nicotine  14 mg Transdermal QHS  . QUEtiapine  75 mg Oral QHS   Continuous Infusions: . heparin 1,500 Units/hr (05/11/15 1109)  . lactated ringers 125 mL/hr at  05/12/15 0011    Time spent: 35 minutes with > 50% of time discussing current diagnostic test results, clinical impression and plan of care.   LOS: 2 days   [facility] Pr[facility] HospitalELMAHI,Tezra Mahr A  [facility] Pager [phone]. If unable to reach me by pager, please call my cell phone at [phone].  *Please refer to [email or web address], password [email or web address] to get updated schedule on who will round on this patient, as hospitalists switch teams weekly. If 7PM-7AM, please contact night-coverage at [email or web address], password [email or web address] for any overnight needs.  05/12/2015, 4:47 PM

## 2015-05-12 NOTE — Progress Notes (Signed)
Pt[facility] HR [facility] to 130s -140s [facility]

## 2015-05-13 ENCOUNTER — Inpatient Hospital Stay (HOSPITAL_COMMUNITY): Payer: Medicaid - Out of State

## 2015-05-13 LAB — URINALYSIS, ROUTINE W REFLEX MICROSCOPIC
Bilirubin Urine: NEGATIVE
GLUCOSE, UA: NEGATIVE mg/dL
KETONES UR: 15 mg/dL — AB
LEUKOCYTES UA: NEGATIVE
NITRITE: NEGATIVE
PH: 6 (ref 5.0–8.0)
PROTEIN: NEGATIVE mg/dL
Specific Gravity, Urine: 1.008 (ref 1.005–1.030)

## 2015-05-13 LAB — CBC
HCT: 30.8 % — ABNORMAL LOW (ref 36.0–46.0)
Hemoglobin: 10.1 g/dL — ABNORMAL LOW (ref 12.0–15.0)
MCH: 26.9 pg (ref 26.0–34.0)
MCHC: 32.8 g/dL (ref 30.0–36.0)
MCV: 82.1 fL (ref 78.0–100.0)
PLATELETS: 245 10*3/uL (ref 150–400)
RBC: 3.75 MIL/uL — AB (ref 3.87–5.11)
RDW: 22.4 % — AB (ref 11.5–15.5)
WBC: 9.9 10*3/uL (ref 4.0–10.5)

## 2015-05-13 LAB — URINE MICROSCOPIC-ADD ON

## 2015-05-13 LAB — COMPREHENSIVE METABOLIC PANEL
ALBUMIN: 3.1 g/dL — AB (ref 3.5–5.0)
ALT: 17 U/L (ref 14–54)
ANION GAP: 9 (ref 5–15)
AST: 26 U/L (ref 15–41)
Alkaline Phosphatase: 45 U/L (ref 38–126)
CHLORIDE: 102 mmol/L (ref 101–111)
CO2: 23 mmol/L (ref 22–32)
Calcium: 8.2 mg/dL — ABNORMAL LOW (ref 8.9–10.3)
Creatinine, Ser: 0.48 mg/dL (ref 0.44–1.00)
GFR calc Af Amer: >60 mL/min (ref >60)
GFR calc non Af Amer: >60 mL/min (ref >60)
GLUCOSE: 97 mg/dL (ref 65–99)
POTASSIUM: 3.6 mmol/L (ref 3.5–5.1)
SODIUM: 134 mmol/L — AB (ref 135–145)
Total Bilirubin: 1 mg/dL (ref 0.3–1.2)
Total Protein: 6.3 g/dL — ABNORMAL LOW (ref 6.5–8.1)

## 2015-05-13 LAB — GLUCOSE, CAPILLARY
Glucose-Capillary: 98 mg/dL (ref 65–99)
Glucose-Capillary: 75 mg/dL (ref 65–99)
Glucose-Capillary: 77 mg/dL (ref 65–99)
Glucose-Capillary: 94 mg/dL (ref 65–99)

## 2015-05-13 LAB — HEPARIN LEVEL (UNFRACTIONATED): HEPARIN UNFRACTIONATED: 0.34 [IU]/mL (ref 0.30–0.70)

## 2015-05-13 MED ORDER — IOHEXOL 300 MG/ML  SOLN
50.0000 mL | Freq: Once | INTRAMUSCULAR | Status: AC | PRN
  Administered 2015-05-13: 50 mL via ORAL

## 2015-05-13 MED ORDER — GUAIFENESIN-DM 100-10 MG/5ML PO SYRP
5.0000 mL | ORAL_SOLUTION | ORAL | Status: DC | PRN
  Administered 2015-05-13 – 2015-05-16 (×10): 5 mL via ORAL
  Filled 2015-05-13 (×10): qty 10

## 2015-05-13 MED ORDER — POTASSIUM CHLORIDE CRYS ER 20 MEQ PO TBCR
40.0000 meq | EXTENDED_RELEASE_TABLET | Freq: Once | ORAL | Status: AC
  Administered 2015-05-13: 40 meq via ORAL
  Filled 2015-05-13: qty 2

## 2015-05-13 MED ORDER — ENOXAPARIN SODIUM 100 MG/ML ~~LOC~~ SOLN
1.0000 mg/kg | Freq: Two times a day (BID) | SUBCUTANEOUS | Status: DC
  Administered 2015-05-13 – 2015-05-18 (×10): 100 mg via SUBCUTANEOUS
  Filled 2015-05-13 (×12): qty 1

## 2015-05-13 MED ORDER — HYDROMORPHONE HCL 1 MG/ML IJ SOLN
1.0000 mg | INTRAMUSCULAR | Status: DC | PRN
  Administered 2015-05-13 – 2015-05-16 (×16): 1 mg via INTRAVENOUS
  Filled 2015-05-13 (×16): qty 1

## 2015-05-13 MED ORDER — IOPAMIDOL (ISOVUE-300) INJECTION 61%
100.0000 mL | Freq: Once | INTRAVENOUS | Status: AC | PRN
  Administered 2015-05-13: 100 mL via INTRAVENOUS

## 2015-05-13 MED ORDER — POLYETHYLENE GLYCOL 3350 17 G PO PACK
17.0000 g | PACK | Freq: Every day | ORAL | Status: DC
  Administered 2015-05-13 – 2015-05-18 (×5): 17 g via ORAL
  Filled 2015-05-13 (×8): qty 1

## 2015-05-13 MED ORDER — DIPHENHYDRAMINE HCL 25 MG PO CAPS
25.0000 mg | ORAL_CAPSULE | Freq: Four times a day (QID) | ORAL | Status: DC | PRN
  Administered 2015-05-13 – 2015-05-15 (×4): 25 mg via ORAL
  Filled 2015-05-13 (×4): qty 1

## 2015-05-13 MED ORDER — OXYCODONE HCL 5 MG PO TABS
5.0000 mg | ORAL_TABLET | ORAL | Status: DC | PRN
  Administered 2015-05-13 – 2015-05-18 (×27): 10 mg via ORAL
  Filled 2015-05-13 (×28): qty 2

## 2015-05-13 MED ORDER — HYDROMORPHONE HCL 1 MG/ML IJ SOLN
1.0000 mg | INTRAMUSCULAR | Status: AC
  Administered 2015-05-13: 1 mg via INTRAVENOUS
  Filled 2015-05-13: qty 1

## 2015-05-13 NOTE — Progress Notes (Signed)
Progress Note   Molly CarnesLadonna Josten ZOX:[id number] DOB: 09/02/1975 DOA: 05/10/2015 PCP: PROVIDER NOT IN SYSTEM  Subjective:   Reported the abdominal pain as 5/10 improved since admission, but she still has left-sided flank pain which is 8/10. I've discussed with her, I will take her off of the Dilaudid PCA. Start on clear liquids.  Brief Narrative:   Molly Lee is an 40 y.o. female with a PMH of binge drinking, lymphedema of the lower extremities, hypertension and history of gastric bypass in 2000 and prior cholecystectomy who was admitted 05/10/15 with acute alcoholic pancreatitis.  Upon initial evaluation in the ED, her lipase was 801, and a CT of her abdomen and pelvis showed moderate to severe acute pancreatitis with surrounding peripancreatic inflammatory changes and free fluid. No pseudocyst. There was also an acute partial thrombus of the main portal vein. The patient admitted to binge drinking, up to a 12 pack of beer a day on the weekends. No history of alcohol withdrawal.  Assessment/Plan:    Acute alcoholic pancreatitis - Imaging shows moderate-severe pancreatitis, without hemorrhage or necrosis. - Source of pancreatitis felt to be from: Binge drinking. Triglycerides 118. - Has concurrent portal vein thrombosis.  - Consider early enteral nutrition over (TPN). - Monitor for complications: Glucose intolerance, DM, splenic aneurysm or fever. - Pain is 5/10 improved very much since admission, will start on sips of clears.   Portal vein thrombosis - Continue IV heparin. Will likely need to be switched to an oral agent and to continue therapy for 3-6 months. - Taken off of the heparin, Phenol Lovenox.    Left flank pain - Has severe left flank pain since yesterday, this is worse than on admission on this side. - Differential include his pancreatitis itself, portal vein thrombosis and or other pathologies like pneumonia or pyelonephritis. - Repeat urine culture, she is not  on antibiotics and not having fevers. If persists will repeat the CT of the abdomen.   Hypertension - Hold Lasix and ACE inhibitor as fluid volume shifts are likely to occur as pancreatitis progresses. - Continue metoprolol as blood pressure tolerates.   Hypocalcemia - Monitor closely. Likely indicative of fat necrosis/saponification.   Alcohol consumption binge drinking - Monitor for signs of withdrawal, although the patient admits only to binge drinking on weekends.   Bipolar affective (HCC) - Continue Seroquel.   Hyperglycemia - Likely glucose intolerance induced from pancreatitis. Monitor CBGs and initiate insulin therapy if needed.   Left lower extremity asymmetric edema - No DVT on Doppler ultrasound.   DVT prophylaxis - On therapeutic anticoagulation, on Lovenox.   Family Communication/Anticipated D/C date and plan/Code Status   Family Communication: No family currently at the bedside.  Mother updated by telephone with patient's permission. Disposition Plan/date: Home when stable, likely will need several more days in the hospital. Code Status: Full code.   IV Access:    Peripheral IV  Procedures and diagnostic studies:   Ct Abdomen Pelvis W Contrast  05/10/2015  CLINICAL DATA:  Epigastric abdominal pain with nausea. Prior gastric bypass and cholecystectomy. EXAM: CT ABDOMEN AND PELVIS WITH CONTRAST TECHNIQUE: Multidetector CT imaging of the abdomen and pelvis was performed using the standard protocol following bolus administration of intravenous contrast. CONTRAST:  100mL ISOVUE-300 IOPAMIDOL (ISOVUE-300) INJECTION 61% COMPARISON:  None. FINDINGS: Lower chest: Minor bibasilar atelectasis. No lower lobe pneumonia, collapse or consolidation. No pericardial or pleural effusion. Hepatobiliary: Prior cholecystectomy evident. No focal hepatic abnormality or biliary dilatation. Hypodense nonocclusive filling defect within the main  portal vein compatible with portal  vein thrombus. The portal venous outflow, right and left portal veins within the liver still remain patent. Hepatic veins also are patent. The mesenteric and splenic veins remain patent. Pancreas: Diffuse edema and inflammatory change throughout the retroperitoneum and surrounding the pancreas. Free fluid and edema throughout the central mesentery. Small amount abdominal free fluid along both pericolic gutters and extending into the pelvis. Findings are compatible with moderate to severe acute pancreatitis. Despite this, the pancreas tissue continues to enhance without evidence necrosis. No focal fluid collection to suggest pseudocyst or abscess formation at this point. No ductal dilatation. Spleen: Within normal limits in size and appearance. Adrenals/Urinary Tract: No masses identified. No evidence of hydronephrosis. Stomach/Bowel: Prior gastric bypass evident. Mild nonspecific small bowel dilatation without significant obstruction pattern. No free air. Normal appendix demonstrated. Vascular/Lymphatic: Intact aorta. Negative for aneurysm or acute aortic abnormality. No dissection. No adenopathy demonstrated. Nonocclusive main portal vein thrombus has a blood secondary to the pancreatitis. Reproductive: Uterus and adnexa normal in size. Tubal ligation clips noted bilaterally. No fluid collection or abscess. Other: No inguinal abnormality or hernia.  Intact abdominal wall. Musculoskeletal: Diffuse degenerative changes of the spine and SI joints. No acute osseous finding. Vacuum disc phenomenon at L5-S1. IMPRESSION: Moderate to severe acute pancreatitis with surrounding peripancreatic inflammatory changes and free fluid. There is also a central mesenteric edema and abdominal free fluid along the pericolic gutters extending into pelvis. No fluid collection, abscess or definite pseudocyst. Acute partial thrombosis of the main portal vein as above. These results were called by telephone at the time of interpretation on  05/10/2015 at 2:06 pm to Dr. Bethann Berkshire , who verbally acknowledged these results. Electronically Signed   By: Judie Petit.  Shick M.D.   On: 05/10/2015 14:07     Medical Consultants:    None.  Anti-Infectives:   Anti-infectives    None     Objective:    Filed Vitals:   05/13/15 0732 05/13/15 0800 05/13/15 1100 05/13/15 1333  BP:  127/69  136/93  Pulse:  95  103  Temp:  98.4 F (36.9 C)  98.6 F (37 C)  TempSrc:  Oral  Oral  Resp: Height:      Weight:      SpO2: 94% 96% 95%     Intake/Output Summary (Last 24 hours) at 05/13/15 1450 Last data filed at 05/13/15 0902  Gross per 24 hour  Intake      0 ml  Output    250 ml  Net   -250 ml   Filed Weights   05/10/15 0934  Weight: 99.791 kg (220 lb)    Exam: Gen:  Tearful. Cardiovascular:  Tachy, No M/R/G Respiratory:  Lungs CTAB  Gastrointestinal:  Abdomen slightly distended, tender diffusely Extremities:  No C/E/C   Data Reviewed:    Labs: Basic Metabolic Panel:  Recent Labs Lab 05/10/15 1144 05/11/15 0150 05/12/15 0520 05/13/15 0534  NA 141 136 131* 134*  K 3.8 3.8 3.1* 3.6  CL 110 108 102 102  CO2 20* 21* 22 23  GLUCOSE 134* 129* 83 97  BUN 9 7 5* <5*  CREATININE 0.64 0.59 0.52 0.48  CALCIUM 8.5* 8.4* 8.0* 8.2*   GFR Estimated Creatinine Clearance: 113.5 mL/min (by C-G formula based on Cr of 0.48). Liver Function Tests:  Recent Labs Lab 05/10/15 1144 05/12/15 0520 05/13/15 0534  AST 62* 25 26  ALT 36 20 17  ALKPHOS 48 43 45  BILITOT 1.5* 0.7 1.0  PROT 7.4 6.4* 6.3*  ALBUMIN 4.1 3.2* 3.1*    Recent Labs Lab 05/10/15 1144 05/11/15 0150 05/12/15 0520  LIPASE 801* 601* 323*   Coagulation profile  Recent Labs Lab 05/10/15 1845  INR 1.28    CBC:  Recent Labs Lab 05/10/15 1144 05/11/15 0150 05/12/15 0520 05/13/15 0534  WBC 11.0* 11.2* 11.9* 9.9  NEUTROABS 9.5*  --   --   --   HGB 12.6 12.1 11.1* 10.1*  HCT 38.0 36.4 32.9* 30.8*  MCV 79.5 80.5 80.8 82.1   PLT 388 341 240 245   CBG:  Recent Labs Lab 05/12/15 0617 05/12/15 1855 05/12/15 2309 05/13/15 0605 05/13/15 1133  GLUCAP 96 67 68 98 75   Lipid Profile:  Recent Labs  05/10/15 1845  CHOL 128  HDL 64  LDLCALC 40  TRIG 118  CHOLHDL 2.0     Medications:   . enoxaparin (LOVENOX) injection  1 mg/kg Subcutaneous Q12H  . metoprolol tartrate  25 mg Oral BID  . nicotine  14 mg Transdermal QHS  . polyethylene glycol  17 g Oral Daily  . QUEtiapine  75 mg Oral QHS   Continuous Infusions: . lactated ringers 100 mL/hr at 05/13/15 1411    Time spent: 35 minutes with > 50% of time discussing current diagnostic test results, clinical impression and plan of care.   LOS: 3 days   [facility]  [facility] Pager [phone]. If unable to reach me by pager, please call my cell phone at [phone].  *Please refer to [email or web address], password [email or web address] to get updated schedule on who will round on this patient, as hospitalists switch teams weekly. If 7PM-7AM, please contact night-coverage at [email or web address], password [email or web address] for any overnight needs.  05/13/2015, 2:50 PM

## 2015-05-13 NOTE — Progress Notes (Addendum)
ANTICOAGULATION CONSULT NOTE - Follow Up Consult  Pharmacy Consult for heparin IV --> Lovenox Indication: Portal vein thrombosis  No Known Allergies  Patient Measurements: Height: 5\' 7"  (170.2 cm) Weight: 220 lb (99.791 kg) IBW/kg (Calculated) : 61.6 Heparin Dosing Weight: 84 kg  Vital Signs: Temp: 98.7 F (37.1 C) (03/30 0600) Temp Source: Oral (03/30 0600) BP: 122/77 mmHg (03/30 0600) Pulse Rate: 108 (03/30 0600)  Labs:  Recent Labs  05/10/15 1845  05/11/15 0150 05/11/15 0814 05/12/15 0520 05/13/15 0534  HGB  --   --  12.1  --  11.1* 10.1*  HCT  --   --  36.4  --  32.9* 30.8*  PLT  --   --  341  --  240 245  APTT 29  --   --   --   --   --   LABPROT 15.6*  --   --   --   --   --   INR 1.28  --   --   --   --   --   HEPARINUNFRC  --   < > 0.56 0.54 0.55 0.34  CREATININE  --   --  0.59  --  0.52 0.48  < > = values in this interval not displayed.  Estimated Creatinine Clearance: 113.5 mL/min (by C-G formula based on Cr of 0.48).   Medical History: Past Medical History  Diagnosis Date  . Hypertension   . Bipolar affect, depressed (HCC)   . Lymphedema     Assessment: [age] yoF presenting 3/27 with abdominal pain, found to have acute pancreatitis as well as partial portal vein thrombus.  Pharmacy to dose heparin.  PMH lymphedema, HTN, gastric bypass (2000). Dopplers of left lower extremity negative for DVT.  Transition to treatment Lovenox 3/30.   Baseline INR, aPTT: wnl  Prior anticoagulation: none  Today, 05/13/2015:  Heparin level therapeutic this AM. Stopped at ~1100.  Will start Lovenox at 1200.  Hgb decreased to 10.1, Plts WNL  No bleeding or infusion issues documented  CrCl: wnl  Goal of Therapy: 4-hour anti-Xa level 0.6-1 unit/ml Monitor platelets by anticoagulation protocol: Yes  Plan:  Lovenox 100 mg (1 mg/kg) SQ q12h.  CBC q72h.  Clance BollAmanda Geraldean Walen, PharmD, BCPS Pager: [phone] 05/13/2015 7:51 AM

## 2015-05-14 LAB — URINE CULTURE

## 2015-05-14 LAB — CBC
HCT: 29.2 % — ABNORMAL LOW (ref 36.0–46.0)
HEMOGLOBIN: 9.6 g/dL — AB (ref 12.0–15.0)
MCH: 27 pg (ref 26.0–34.0)
MCHC: 32.9 g/dL (ref 30.0–36.0)
MCV: 82.3 fL (ref 78.0–100.0)
Platelets: 248 10*3/uL (ref 150–400)
RBC: 3.55 MIL/uL — AB (ref 3.87–5.11)
RDW: 22.7 % — ABNORMAL HIGH (ref 11.5–15.5)
WBC: 7.5 10*3/uL (ref 4.0–10.5)

## 2015-05-14 LAB — COMPREHENSIVE METABOLIC PANEL
ALBUMIN: 2.9 g/dL — AB (ref 3.5–5.0)
ALK PHOS: 52 U/L (ref 38–126)
ALT: 20 U/L (ref 14–54)
AST: 30 U/L (ref 15–41)
Anion gap: 10 (ref 5–15)
BUN: <5 mg/dL — ABNORMAL LOW (ref 6–20)
CALCIUM: 8.5 mg/dL — AB (ref 8.9–10.3)
CO2: 25 mmol/L (ref 22–32)
CREATININE: 0.5 mg/dL (ref 0.44–1.00)
Chloride: 102 mmol/L (ref 101–111)
GFR calc Af Amer: >60 mL/min (ref >60)
GFR calc non Af Amer: >60 mL/min (ref >60)
GLUCOSE: 89 mg/dL (ref 65–99)
Potassium: 3.7 mmol/L (ref 3.5–5.1)
SODIUM: 137 mmol/L (ref 135–145)
Total Bilirubin: 0.8 mg/dL (ref 0.3–1.2)
Total Protein: 6.3 g/dL — ABNORMAL LOW (ref 6.5–8.1)

## 2015-05-14 LAB — GLUCOSE, CAPILLARY
Glucose-Capillary: 85 mg/dL (ref 65–99)
Glucose-Capillary: 152 mg/dL — ABNORMAL HIGH (ref 65–99)
Glucose-Capillary: 91 mg/dL (ref 65–99)

## 2015-05-14 LAB — LIPASE, BLOOD: Lipase: 107 U/L — ABNORMAL HIGH (ref 11–51)

## 2015-05-14 NOTE — Progress Notes (Signed)
Progress Note   Molly Lee ZOX:[id number] DOB: [birth year] DOA: 05/10/2015 PCP: PROVIDER NOT IN SYSTEM  Subjective:   Pain was getting worse last night, CT scan repeated and showed slight worsening of pancreatitis. Back to nothing by mouth.  Brief Narrative:   Molly Lee is an 40 y.o. female with a PMH of binge drinking, lymphedema of the lower extremities, hypertension and history of gastric bypass in 2000 and prior cholecystectomy who was admitted 05/10/15 with acute alcoholic pancreatitis.  Upon initial evaluation in the ED, her lipase was 801, and a CT of her abdomen and pelvis showed moderate to severe acute pancreatitis with surrounding peripancreatic inflammatory changes and free fluid. No pseudocyst. There was also an acute partial thrombus of the main portal vein. The patient admitted to binge drinking, up to a 12 pack of beer a day on the weekends. No history of alcohol withdrawal.  Assessment/Plan:    Acute alcoholic pancreatitis - Imaging shows moderate-severe pancreatitis, without hemorrhage or necrosis. - Source of pancreatitis felt to be from: Binge drinking. Triglycerides 118. - Has concurrent portal vein thrombosis.  - Consider early enteral nutrition over (TPN). - Monitor for complications: Glucose intolerance, DM, splenic aneurysm or fever. - CT scan of the abdomen/pelvis repeated showed a slight worsening of pancreatitis without pseudocyst. - Wasn't clear liquids placed on NPO.   Portal vein thrombosis - Continue IV heparin. Will likely need to be switched to an oral agent and to continue therapy for 3-6 months. - Taken off of the heparin, Phenol Lovenox.    Left flank pain - Has severe left flank pain since yesterday, this is worse than on admission on this side. - Differential include his pancreatitis itself, portal vein thrombosis and or other pathologies like pneumonia or pyelonephritis. - Repeat urine culture, she is not on antibiotics and not  having fevers. If persists will repeat the CT of the abdomen.   Hypertension - Hold Lasix and ACE inhibitor as fluid volume shifts are likely to occur as pancreatitis progresses. - Continue metoprolol as blood pressure tolerates.   Hypocalcemia - Monitor closely. Likely indicative of fat necrosis/saponification.   Alcohol consumption binge drinking - Monitor for signs of withdrawal, although the patient admits only to binge drinking on weekends.   Bipolar affective (HCC) - Continue Seroquel.   Hyperglycemia - Likely glucose intolerance induced from pancreatitis. Monitor CBGs and initiate insulin therapy if needed.   Left lower extremity asymmetric edema - No DVT on Doppler ultrasound.   DVT prophylaxis - On therapeutic anticoagulation, on Lovenox.   Family Communication/Anticipated D/C date and plan/Code Status   Family Communication: No family currently at the bedside.  Mother updated by telephone with patient's permission. Disposition Plan/date: Home when stable, likely will need several more days in the hospital. Code Status: Full code.   IV Access:    Peripheral IV  Procedures and diagnostic studies:   Ct Abdomen Pelvis W Contrast  05/10/2015  CLINICAL DATA:  Epigastric abdominal pain with nausea. Prior gastric bypass and cholecystectomy. EXAM: CT ABDOMEN AND PELVIS WITH CONTRAST TECHNIQUE: Multidetector CT imaging of the abdomen and pelvis was performed using the standard protocol following bolus administration of intravenous contrast. CONTRAST:  ISOVUE-300 IOPAMIDOL (ISOVUE-300) INJECTION 61% COMPARISON:  None. FINDINGS: Lower chest: Minor bibasilar atelectasis. No lower lobe pneumonia, collapse or consolidation. No pericardial or pleural effusion. Hepatobiliary: Prior cholecystectomy evident. No focal hepatic abnormality or biliary dilatation. Hypodense nonocclusive filling defect within the main portal vein compatible with portal vein thrombus. The portal  venous outflow, right and left portal veins within the liver still remain patent. Hepatic veins also are patent. The mesenteric and splenic veins remain patent. Pancreas: Diffuse edema and inflammatory change throughout the retroperitoneum and surrounding the pancreas. Free fluid and edema throughout the central mesentery. Small amount abdominal free fluid along both pericolic gutters and extending into the pelvis. Findings are compatible with moderate to severe acute pancreatitis. Despite this, the pancreas tissue continues to enhance without evidence necrosis. No focal fluid collection to suggest pseudocyst or abscess formation at this point. No ductal dilatation. Spleen: Within normal limits in size and appearance. Adrenals/Urinary Tract: No masses identified. No evidence of hydronephrosis. Stomach/Bowel: Prior gastric bypass evident. Mild nonspecific small bowel dilatation without significant obstruction pattern. No free air. Normal appendix demonstrated. Vascular/Lymphatic: Intact aorta. Negative for aneurysm or acute aortic abnormality. No dissection. No adenopathy demonstrated. Nonocclusive main portal vein thrombus has a blood secondary to the pancreatitis. Reproductive: Uterus and adnexa normal in size. Tubal ligation clips noted bilaterally. No fluid collection or abscess. Other: No inguinal abnormality or hernia.  Intact abdominal wall. Musculoskeletal: Diffuse degenerative changes of the spine and SI joints. No acute osseous finding. Vacuum disc phenomenon at L5-S1. IMPRESSION: Moderate to severe acute pancreatitis with surrounding peripancreatic inflammatory changes and free fluid. There is also a central mesenteric edema and abdominal free fluid along the pericolic gutters extending into pelvis. No fluid collection, abscess or definite pseudocyst. Acute partial thrombosis of the main portal vein as above. These results were called by telephone at the time of interpretation on 05/10/2015 at 2:06 pm to  Dr. Bethann BerkshireJOSEPH ZAMMIT , who verbally acknowledged these results. Electronically Signed   By: Judie PetitM.  Shick M.D.   On: 05/10/2015 14:07     Medical Consultants:    None.  Anti-Infectives:   Anti-infectives    None     Objective:    Filed Vitals:   05/13/15 1333 05/13/15 2105 05/14/15 0505 05/14/15 1407  BP: 136/93 136/89 134/74 150/92  Pulse: 103 100 103 91  Temp: 98.6 F (37 C) 98.1 F (36.7 C) 99.6 F (37.6 C) 99.2 F (37.3 C)  TempSrc: Oral Oral Oral Oral  Resp: 18 22 16 18   Height:      Weight:      SpO2:   100% 94%   No intake or output data in the 24 hours ending 05/14/15 1516 Filed Weights   05/10/15 0934  Weight: 99.791 kg (220 lb)    Exam: Gen:  Tearful. Cardiovascular:  Tachy, No M/R/G Respiratory:  Lungs CTAB  Gastrointestinal:  Abdomen slightly distended, tender diffusely Extremities:  No C/E/C   Data Reviewed:    Labs: Basic Metabolic Panel:  Recent Labs Lab 05/10/15 1144 05/11/15 0150 05/12/15 0520 05/13/15 0534 05/14/15 0527  NA 141 136 131* 134* 137  K 3.8 3.8 3.1* 3.6 3.7  CL 110 108 102 102 102  CO2 20* 21* 22 23 25   GLUCOSE 134* 129* 83 97 89  BUN 9 7 5* <5* <5*  CREATININE 0.64 0.59 0.52 0.48 0.50  CALCIUM 8.5* 8.4* 8.0* 8.2* 8.5*   GFR Estimated Creatinine Clearance: 113.5 mL/min (by C-G formula based on Cr of 0.5). Liver Function Tests:  Recent Labs Lab 05/10/15 1144 05/12/15 0520 05/13/15 0534 05/14/15 0527  AST 62* 25 26 30   ALT 36 20 17 20   ALKPHOS 48 43 45 52  BILITOT 1.5* 0.7 1.0 0.8  PROT 7.4 6.4* 6.3* 6.3*  ALBUMIN 4.1 3.2* 3.1* 2.9*  Recent Labs Lab 05/10/15 1144 05/11/15 0150 05/12/15 0520 05/14/15 0527  LIPASE 801* 601* 323* 107*   Coagulation profile  Recent Labs Lab 05/10/15 1845  INR 1.28    CBC:  Recent Labs Lab 05/10/15 1144 05/11/15 0150 05/12/15 0520 05/13/15 0534 05/14/15 0527  WBC 11.0* 11.2* 11.9* 9.9 7.5  NEUTROABS 9.5*  --   --   --   --   HGB 12.6 12.1 11.1* 10.1*  9.6*  HCT 38.0 36.4 32.9* 30.8* 29.2*  MCV 79.5 80.5 80.8 82.1 82.3  PLT 388 341 240 245 248   CBG:  Recent Labs Lab 05/13/15 1133 05/13/15 1712 05/13/15 2314 05/14/15 0557 05/14/15 1150  GLUCAP 75 77 94 85 152*   Lipid Profile: No results for input(s): CHOL, HDL, LDLCALC, TRIG, CHOLHDL, LDLDIRECT in the last 72 hours.   Medications:   . enoxaparin (LOVENOX) injection  1 mg/kg Subcutaneous Q12H  . metoprolol tartrate  25 mg Oral BID  . nicotine  14 mg Transdermal QHS  . polyethylene glycol  17 g Oral Daily  . QUEtiapine  75 mg Oral QHS   Continuous Infusions: . lactated ringers 100 mL/hr at 05/14/15 0040    Time spent: 35 minutes with > 50% of time discussing current diagnostic test results, clinical impression and plan of care.   LOS: 4 days   [facility]  [facility] Pager [phone]. If unable to reach me by pager, please call my cell phone at [phone].  *Please refer to [email or web address], password [email or web address] to get updated schedule on who will round on this patient, as hospitalists switch teams weekly. If 7PM-7AM, please contact night-coverage at [email or web address], password [email or web address] for any overnight needs.  05/14/2015, 3:16 PM

## 2015-05-15 LAB — GLUCOSE, CAPILLARY
GLUCOSE-CAPILLARY: 85 mg/dL (ref 65–99)
Glucose-Capillary: 77 mg/dL (ref 65–99)
Glucose-Capillary: 90 mg/dL (ref 65–99)
Glucose-Capillary: 91 mg/dL (ref 65–99)

## 2015-05-15 MED ORDER — MENTHOL 3 MG MT LOZG
1.0000 | LOZENGE | OROMUCOSAL | Status: DC | PRN
  Administered 2015-05-15: 3 mg via ORAL
  Filled 2015-05-15: qty 9

## 2015-05-15 NOTE — Progress Notes (Signed)
Progress Note   Molly Lee [id number] DOB: 07/25/1975 DOA: 05/10/2015 PCP: PROVIDER NOT IN SYSTEM  Subjective:   Pain is 7/10, not hungry, continue nothing by mouth for today. Complaining about sore throat, started on Cepacol lozenges  Brief Narrative:   Molly Lee is an 40 y.o. female with a PMH of binge drinking, lymphedema of the lower extremities, hypertension and history of gastric bypass in 2000 and prior cholecystectomy who was admitted 05/10/15 with acute alcoholic pancreatitis.  Upon initial evaluation in the ED, her lipase was 801, and a CT of her abdomen and pelvis showed moderate to severe acute pancreatitis with surrounding peripancreatic inflammatory changes and free fluid. No pseudocyst. There was also an acute partial thrombus of the main portal vein. The patient admitted to binge drinking, up to a 12 pack of beer a day on the weekends. No history of alcohol withdrawal.  Assessment/Plan:    Acute alcoholic pancreatitis - Imaging shows moderate-severe pancreatitis, without hemorrhage or necrosis. - Source of pancreatitis felt to be from: Binge drinking. Triglycerides 118. - Has concurrent portal vein thrombosis.  - Consider early enteral nutrition over (TPN). - Monitor for complications: Glucose intolerance, DM, splenic aneurysm or fever. - CT scan of the abdomen/pelvis repeated showed a slight worsening of pancreatitis without pseudocyst. - Is nothing by mouth, overall feeling much better but pain still 7/10.   Portal vein thrombosis - Continue IV heparin. Will likely need to be switched to an oral agent and to continue therapy for 3-6 months. - Taken off of the heparin, switched to Lovenox. Likely can discharge her to oral anticoagulant.    Left flank pain - Has severe left flank pain since yesterday, this is worse than on admission on this side. - Differential include his pancreatitis itself, portal vein thrombosis and or other pathologies like  pneumonia or pyelonephritis. - Repeat urine culture, she is not on antibiotics and not having fevers. If persists will repeat the CT of the abdomen.   Hypertension - Hold Lasix and ACE inhibitor as fluid volume shifts are likely to occur as pancreatitis progresses. - Continue metoprolol as blood pressure tolerates.   Hypocalcemia - Monitor closely. Likely indicative of fat necrosis/saponification.   Alcohol consumption binge drinking - Monitor for signs of withdrawal, although the patient admits only to binge drinking on weekends.   Bipolar affective (HCC) - Continue Seroquel.   Hyperglycemia - Likely glucose intolerance induced from pancreatitis. Monitor CBGs and initiate insulin therapy if needed.   Left lower extremity asymmetric edema - No DVT on Doppler ultrasound.   DVT prophylaxis - On therapeutic anticoagulation, on Lovenox.   Family Communication/Anticipated D/C date and plan/Code Status   Family Communication: No family currently at the bedside.  Mother updated by telephone with patient's permission. Disposition Plan/date: Home when stable, likely will need several more days in the hospital. Code Status: Full code.   IV Access:    Peripheral IV  Procedures and diagnostic studies:   Ct Abdomen Pelvis W Contrast  05/10/2015  CLINICAL DATA:  Epigastric abdominal pain with nausea. Prior gastric bypass and cholecystectomy. EXAM: CT ABDOMEN AND PELVIS WITH CONTRAST TECHNIQUE: Multidetector CT imaging of the abdomen and pelvis was performed using the standard protocol following bolus administration of intravenous contrast. CONTRAST:  100mL ISOVUE-300 IOPAMIDOL (ISOVUE-300) INJECTION 61% COMPARISON:  None. FINDINGS: Lower chest: Minor bibasilar atelectasis. No lower lobe pneumonia, collapse or consolidation. No pericardial or pleural effusion. Hepatobiliary: Prior cholecystectomy evident. No focal hepatic abnormality or biliary dilatation. Hypodense nonocclusive  filling defect within the main portal vein compatible with portal vein thrombus. The portal venous outflow, right and left portal veins within the liver still remain patent. Hepatic veins also are patent. The mesenteric and splenic veins remain patent. Pancreas: Diffuse edema and inflammatory change throughout the retroperitoneum and surrounding the pancreas. Free fluid and edema throughout the central mesentery. Small amount abdominal free fluid along both pericolic gutters and extending into the pelvis. Findings are compatible with moderate to severe acute pancreatitis. Despite this, the pancreas tissue continues to enhance without evidence necrosis. No focal fluid collection to suggest pseudocyst or abscess formation at this point. No ductal dilatation. Spleen: Within normal limits in size and appearance. Adrenals/Urinary Tract: No masses identified. No evidence of hydronephrosis. Stomach/Bowel: Prior gastric bypass evident. Mild nonspecific small bowel dilatation without significant obstruction pattern. No free air. Normal appendix demonstrated. Vascular/Lymphatic: Intact aorta. Negative for aneurysm or acute aortic abnormality. No dissection. No adenopathy demonstrated. Nonocclusive main portal vein thrombus has a blood secondary to the pancreatitis. Reproductive: Uterus and adnexa normal in size. Tubal ligation clips noted bilaterally. No fluid collection or abscess. Other: No inguinal abnormality or hernia.  Intact abdominal wall. Musculoskeletal: Diffuse degenerative changes of the spine and SI joints. No acute osseous finding. Vacuum disc phenomenon at L5-S1. IMPRESSION: Moderate to severe acute pancreatitis with surrounding peripancreatic inflammatory changes and free fluid. There is also a central mesenteric edema and abdominal free fluid along the pericolic gutters extending into pelvis. No fluid collection, abscess or definite pseudocyst. Acute partial thrombosis of the main portal vein as above. These  results were called by telephone at the time of interpretation on 05/10/2015 at 2:06 pm to Dr. Bethann Berkshire , who verbally acknowledged these results. Electronically Signed   By: Judie Petit.  Shick M.D.   On: 05/10/2015 14:07     Medical Consultants:    None.  Anti-Infectives:   Anti-infectives    None     Objective:    Filed Vitals:   05/14/15 0505 05/14/15 1407 05/14/15 2249 05/15/15 0505  BP: 134/74 150/92 149/87 157/98  Pulse: 103 91 100 101  Temp: 99.6 F (37.6 C) 99.2 F (37.3 C) 99.6 F (37.6 C) 98.4 F (36.9 C)  TempSrc: Oral Oral Oral Oral  Resp: Height:      Weight:      SpO2: 100% 94% 95% 94%    Intake/Output Summary (Last 24 hours) at 05/15/15 1339 Last data filed at 05/15/15 0300  Gross per 24 hour  Intake 12490.42 ml  Output      0 ml  Net 12490.42 ml   Filed Weights   05/10/15 0934  Weight: 99.791 kg (220 lb)    Exam: Gen:  Tearful. Cardiovascular:  Tachy, No M/R/G Respiratory:  Lungs CTAB  Gastrointestinal:  Abdomen slightly distended, tender diffusely Extremities:  No C/E/C   Data Reviewed:    Labs: Basic Metabolic Panel:  Recent Labs Lab 05/10/15 1144 05/11/15 0150 05/12/15 0520 05/13/15 0534 05/14/15 0527  NA 141 136 131* 134* 137  K 3.8 3.8 3.1* 3.6 3.7  CL 110 108 102 102 102  CO2 20* 21* GLUCOSE 134* 129* 83 97 89  BUN 9 7 5* <5* <5*  CREATININE 0.64 0.59 0.52 0.48 0.50  CALCIUM 8.5* 8.4* 8.0* 8.2* 8.5*   GFR Estimated Creatinine Clearance: 113.5 mL/min (by C-G formula based on Cr of 0.5). Liver Function Tests:  Recent Labs Lab 05/10/15 1144 05/12/15 0520 05/13/15 0534  05/14/15 0527  AST 62* ALT 36 ALKPHOS 48 43 45 52  BILITOT 1.5* 0.7 1.0 0.8  PROT 7.4 6.4* 6.3* 6.3*  ALBUMIN 4.1 3.2* 3.1* 2.9*    Recent Labs Lab 05/10/15 1144 05/11/15 0150 05/12/15 0520 05/14/15 0527  LIPASE 801* 601* 323* 107*   Coagulation profile  Recent Labs Lab 05/10/15 1845  INR  1.28    CBC:  Recent Labs Lab 05/10/15 1144 05/11/15 0150 05/12/15 0520 05/13/15 0534 05/14/15 0527  WBC 11.0* 11.2* 11.9* 9.9 7.5  NEUTROABS 9.5*  --   --   --   --   HGB 12.6 12.1 11.1* 10.1* 9.6*  HCT 38.0 36.4 32.9* 30.8* 29.2*  MCV 79.5 80.5 80.8 82.1 82.3  PLT 388 341 240 245 248   CBG:  Recent Labs Lab 05/14/15 0557 05/14/15 1150 05/14/15 1708 05/15/15 0010 05/15/15 0553  GLUCAP 85 152* 91 90 85   Lipid Profile: No results for input(s): CHOL, HDL, LDLCALC, TRIG, CHOLHDL, LDLDIRECT in the last 72 hours.   Medications:   . enoxaparin (LOVENOX) injection  1 mg/kg Subcutaneous Q12H  . metoprolol tartrate  25 mg Oral BID  . nicotine  14 mg Transdermal QHS  . polyethylene glycol  17 g Oral Daily  . QUEtiapine  75 mg Oral QHS   Continuous Infusions: . lactated ringers 100 mL/hr at 05/15/15 1314    Time spent: 35 minutes with > 50% of time discussing current diagnostic test results, clinical impression and plan of care.   LOS: 5 days   [facility]  [facility] Pager [phone]. If unable to reach me by pager, please call my cell phone at [phone].  *Please refer to [email or web address], password [email or web address] to get updated schedule on who will round on this patient, as hospitalists switch teams weekly. If 7PM-7AM, please contact night-coverage at [email or web address], password [email or web address] for any overnight needs.  05/15/2015, 1:39 PM

## 2015-05-16 LAB — CBC
HCT: 29.5 % — ABNORMAL LOW (ref 36.0–46.0)
HEMOGLOBIN: 9.7 g/dL — AB (ref 12.0–15.0)
MCH: 26.8 pg (ref 26.0–34.0)
MCHC: 32.9 g/dL (ref 30.0–36.0)
MCV: 81.5 fL (ref 78.0–100.0)
PLATELETS: 240 10*3/uL (ref 150–400)
RBC: 3.62 MIL/uL — AB (ref 3.87–5.11)
RDW: 22.2 % — ABNORMAL HIGH (ref 11.5–15.5)
WBC: 5.7 10*3/uL (ref 4.0–10.5)

## 2015-05-16 LAB — GLUCOSE, CAPILLARY
GLUCOSE-CAPILLARY: 72 mg/dL (ref 65–99)
GLUCOSE-CAPILLARY: 101 mg/dL — AB (ref 65–99)
Glucose-Capillary: 84 mg/dL (ref 65–99)
Glucose-Capillary: 78 mg/dL (ref 65–99)

## 2015-05-16 MED ORDER — DEXTROSE-NACL 5-0.45 % IV SOLN
INTRAVENOUS | Status: DC
  Administered 2015-05-16: 07:00:00 via INTRAVENOUS

## 2015-05-16 NOTE — Progress Notes (Signed)
ANTICOAGULATION CONSULT NOTE - Follow Up Consult  Pharmacy Consult for Lovenox Indication: Portal vein thrombosis  No Known Allergies  Patient Measurements: Height: 5\' 7"  (170.2 cm) Weight: 220 lb (99.791 kg) IBW/kg (Calculated) : 61.6 Heparin Dosing Weight: 84 kg  Vital Signs: Temp: 98.4 F (36.9 C) (04/02 0600) Temp Source: Oral (04/02 0600) BP: 162/85 mmHg (04/02 0600) Pulse Rate: 85 (04/02 0600)  Labs:  Recent Labs  05/14/15 0527 05/16/15 0523  HGB 9.6* 9.7*  HCT 29.2* 29.5*  PLT 248 240  CREATININE 0.50  --     Estimated Creatinine Clearance: 113.5 mL/min (by C-G formula based on Cr of 0.5).   Medical History: Past Medical History  Diagnosis Date  . Hypertension   . Bipolar affect, depressed (HCC)   . Lymphedema     Assessment: [age] yoF presenting 3/27 with abdominal pain, found to have acute pancreatitis as well as partial portal vein thrombus.  Pharmacy to dose heparin.  PMH lymphedema, HTN, gastric bypass (2000). Dopplers of left lower extremity negative for DVT.  Transition to treatment Lovenox 3/30.  Today, 05/16/2015:  Hgb low but stable over past few days, Plts WNL  No bleeding documented  CrCl > 100 ml/min  Plan for oral anticoagulation at discharge (currenly NPO)  Goal of Therapy: 4-hour anti-Xa level 0.6-1 unit/ml Monitor platelets by anticoagulation protocol: Yes  Plan:  Continue Lovenox 100 mg (1 mg/kg) SQ q12h.  CBC q72h.  Loralee PacasErin Ruthene Methvin, PharmD, BCPS Pager: [phone]  05/16/2015 10:49 AM

## 2015-05-16 NOTE — Progress Notes (Signed)
Progress Note   Molly CarnesLadonna Lee ZOX:[id number] DOB: September 08, 1975 DOA: 05/10/2015 PCP: PROVIDER NOT IN SYSTEM  Subjective:   Still complaining about pain 6/10 Per nursing staff asking for pain medications every 2 hours, continue nothing by mouth status.  Brief Narrative:   Molly Lee is an 40 y.o. female with a PMH of binge drinking, lymphedema of the lower extremities, hypertension and history of gastric bypass in 2000 and prior cholecystectomy who was admitted 05/10/15 with acute alcoholic pancreatitis.  Upon initial evaluation in the ED, her lipase was 801, and a CT of her abdomen and pelvis showed moderate to severe acute pancreatitis with surrounding peripancreatic inflammatory changes and free fluid. No pseudocyst. There was also an acute partial thrombus of the main portal vein. The patient admitted to binge drinking, up to a 12 pack of beer a day on the weekends. No history of alcohol withdrawal.  Assessment/Plan:    Acute alcoholic pancreatitis - Imaging shows moderate-severe pancreatitis, without hemorrhage or necrosis. - Source of pancreatitis felt to be from: Binge drinking. Triglycerides 118. - Has concurrent portal vein thrombosis.  - Consider early enteral nutrition over (TPN). - Monitor for complications: Glucose intolerance, DM, splenic aneurysm or fever. - CT scan of the abdomen/pelvis repeated showed a slight worsening of pancreatitis without pseudocyst. - Still have a lot of pain continue NPO , check later today or in the morning if we need to start clears.   Portal vein thrombosis - Continue IV heparin. Will likely need to be switched to an oral agent and to continue therapy for 3-6 months. - Taken off of the heparin, switched to Lovenox. Likely can discharge her to oral anticoagulant.    Left flank pain - Has severe left flank pain since yesterday, this is worse than on admission on this side. - Differential include his pancreatitis itself, portal vein  thrombosis and or other pathologies like pneumonia or pyelonephritis. - Repeat urine culture, she is not on antibiotics and not having fevers. If persists will repeat the CT of the abdomen.   Hypertension - Hold Lasix and ACE inhibitor as fluid volume shifts are likely to occur as pancreatitis progresses. - Continue metoprolol as blood pressure tolerates.   Hypocalcemia - Monitor closely. Likely indicative of fat necrosis/saponification.   Alcohol consumption binge drinking - Monitor for signs of withdrawal, although the patient admits only to binge drinking on weekends.   Bipolar affective (HCC) - Continue Seroquel.   Hyperglycemia - Likely glucose intolerance induced from pancreatitis. Monitor CBGs and initiate insulin therapy if needed.   Left lower extremity asymmetric edema - No DVT on Doppler ultrasound.   DVT prophylaxis - On therapeutic anticoagulation, on Lovenox.   Family Communication/Anticipated D/C date and plan/Code Status   Family Communication: No family currently at the bedside.  Mother updated by telephone with patient's permission. Disposition Plan/date: Home when stable, likely will need several more days in the hospital. Code Status: Full code.   IV Access:    Peripheral IV  Procedures and diagnostic studies:   Ct Abdomen Pelvis W Contrast  05/10/2015  CLINICAL DATA:  Epigastric abdominal pain with nausea. Prior gastric bypass and cholecystectomy. EXAM: CT ABDOMEN AND PELVIS WITH CONTRAST TECHNIQUE: Multidetector CT imaging of the abdomen and pelvis was performed using the standard protocol following bolus administration of intravenous contrast. CONTRAST:  100mL ISOVUE-300 IOPAMIDOL (ISOVUE-300) INJECTION 61% COMPARISON:  None. FINDINGS: Lower chest: Minor bibasilar atelectasis. No lower lobe pneumonia, collapse or consolidation. No pericardial or pleural effusion. Hepatobiliary: Prior cholecystectomy  evident. No focal hepatic abnormality or  biliary dilatation. Hypodense nonocclusive filling defect within the main portal vein compatible with portal vein thrombus. The portal venous outflow, right and left portal veins within the liver still remain patent. Hepatic veins also are patent. The mesenteric and splenic veins remain patent. Pancreas: Diffuse edema and inflammatory change throughout the retroperitoneum and surrounding the pancreas. Free fluid and edema throughout the central mesentery. Small amount abdominal free fluid along both pericolic gutters and extending into the pelvis. Findings are compatible with moderate to severe acute pancreatitis. Despite this, the pancreas tissue continues to enhance without evidence necrosis. No focal fluid collection to suggest pseudocyst or abscess formation at this point. No ductal dilatation. Spleen: Within normal limits in size and appearance. Adrenals/Urinary Tract: No masses identified. No evidence of hydronephrosis. Stomach/Bowel: Prior gastric bypass evident. Mild nonspecific small bowel dilatation without significant obstruction pattern. No free air. Normal appendix demonstrated. Vascular/Lymphatic: Intact aorta. Negative for aneurysm or acute aortic abnormality. No dissection. No adenopathy demonstrated. Nonocclusive main portal vein thrombus has a blood secondary to the pancreatitis. Reproductive: Uterus and adnexa normal in size. Tubal ligation clips noted bilaterally. No fluid collection or abscess. Other: No inguinal abnormality or hernia.  Intact abdominal wall. Musculoskeletal: Diffuse degenerative changes of the spine and SI joints. No acute osseous finding. Vacuum disc phenomenon at L5-S1. IMPRESSION: Moderate to severe acute pancreatitis with surrounding peripancreatic inflammatory changes and free fluid. There is also a central mesenteric edema and abdominal free fluid along the pericolic gutters extending into pelvis. No fluid collection, abscess or definite pseudocyst. Acute partial  thrombosis of the main portal vein as above. These results were called by telephone at the time of interpretation on 05/10/2015 at 2:06 pm to Dr. Bethann Berkshire , who verbally acknowledged these results. Electronically Signed   By: Judie Petit.  Shick M.D.   On: 05/10/2015 14:07     Medical Consultants:    None.  Anti-Infectives:   Anti-infectives    None     Objective:    Filed Vitals:   05/15/15 1348 05/15/15 2104 05/15/15 2223 05/16/15 0600  BP: 145/84 153/112 128/78 162/85  Pulse: 82 87 89 85  Temp: 98.1 F (36.7 C) 100.3 F (37.9 C)  98.4 F (36.9 C)  TempSrc: Oral Oral  Oral  Resp: Height:      Weight:      SpO2: 94% 95%  100%    Intake/Output Summary (Last 24 hours) at 05/16/15 1207 Last data filed at 05/16/15 0801  Gross per 24 hour  Intake      0 ml  Output      0 ml  Net      0 ml   Filed Weights   05/10/15 0934  Weight: 99.791 kg (220 lb)    Exam: Gen:  Tearful. Cardiovascular:  Tachy, No M/R/G Respiratory:  Lungs CTAB  Gastrointestinal:  Abdomen slightly distended, tender diffusely Extremities:  No C/E/C   Data Reviewed:    Labs: Basic Metabolic Panel:  Recent Labs Lab 05/10/15 1144 05/11/15 0150 05/12/15 0520 05/13/15 0534 05/14/15 0527  NA 141 136 131* 134* 137  K 3.8 3.8 3.1* 3.6 3.7  CL 110 108 102 102 102  CO2 20* 21* GLUCOSE 134* 129* 83 97 89  BUN 9 7 5* <5* <5*  CREATININE 0.64 0.59 0.52 0.48 0.50  CALCIUM 8.5* 8.4* 8.0* 8.2* 8.5*   GFR Estimated Creatinine Clearance: 113.5 mL/min (by C-G formula  based on Cr of 0.5). Liver Function Tests:  Recent Labs Lab 05/10/15 1144 05/12/15 0520 05/13/15 0534 05/14/15 0527  AST 62* ALT 36 ALKPHOS 48 43 45 52  BILITOT 1.5* 0.7 1.0 0.8  PROT 7.4 6.4* 6.3* 6.3*  ALBUMIN 4.1 3.2* 3.1* 2.9*    Recent Labs Lab 05/10/15 1144 05/11/15 0150 05/12/15 0520 05/14/15 0527  LIPASE 801* 601* 323* 107*   Coagulation profile  Recent Labs Lab  05/10/15 1845  INR 1.28    CBC:  Recent Labs Lab 05/10/15 1144 05/11/15 0150 05/12/15 0520 05/13/15 0534 05/14/15 0527 05/16/15 0523  WBC 11.0* 11.2* 11.9* 9.9 7.5 5.7  NEUTROABS 9.5*  --   --   --   --   --   HGB 12.6 12.1 11.1* 10.1* 9.6* 9.7*  HCT 38.0 36.4 32.9* 30.8* 29.2* 29.5*  MCV 79.5 80.5 80.8 82.1 82.3 81.5  PLT 388 341 240 245 248 240   CBG:  Recent Labs Lab 05/15/15 0553 05/15/15 1347 05/15/15 1803 05/16/15 0115 05/16/15 0606  GLUCAP 85 91 77 84 72   Lipid Profile: No results for input(s): CHOL, HDL, LDLCALC, TRIG, CHOLHDL, LDLDIRECT in the last 72 hours.   Medications:   . enoxaparin (LOVENOX) injection  1 mg/kg Subcutaneous Q12H  . metoprolol tartrate  25 mg Oral BID  . nicotine  14 mg Transdermal QHS  . polyethylene glycol  17 g Oral Daily  . QUEtiapine  75 mg Oral QHS   Continuous Infusions: . dextrose 5 % and 0.45% NaCl 100 mL/hr at 05/16/15 0454    Time spent: 35 minutes with > 50% of time discussing current diagnostic test results, clinical impression and plan of care.   LOS: 6 days   [facility]  [facility] Pager [phone]. If unable to reach me by pager, please call my cell phone at [phone].  *Please refer to [email or web address], password [email or web address] to get updated schedule on who will round on this patient, as hospitalists switch teams weekly. If 7PM-7AM, please contact night-coverage at [email or web address], password [email or web address] for any overnight needs.  05/16/2015, 12:07 PM

## 2015-05-16 NOTE — Progress Notes (Signed)
After IV infiltrated, refused new IV site, MD notified.

## 2015-05-17 LAB — GLUCOSE, CAPILLARY
GLUCOSE-CAPILLARY: 83 mg/dL (ref 65–99)
Glucose-Capillary: 87 mg/dL (ref 65–99)
Glucose-Capillary: 78 mg/dL (ref 65–99)

## 2015-05-17 LAB — COMPREHENSIVE METABOLIC PANEL
ALK PHOS: 46 U/L (ref 38–126)
ALT: 22 U/L (ref 14–54)
ANION GAP: 9 (ref 5–15)
AST: 25 U/L (ref 15–41)
Albumin: 2.9 g/dL — ABNORMAL LOW (ref 3.5–5.0)
BILIRUBIN TOTAL: 0.7 mg/dL (ref 0.3–1.2)
BUN: <5 mg/dL — ABNORMAL LOW (ref 6–20)
CO2: 22 mmol/L (ref 22–32)
CREATININE: 0.55 mg/dL (ref 0.44–1.00)
Calcium: 8.4 mg/dL — ABNORMAL LOW (ref 8.9–10.3)
Chloride: 105 mmol/L (ref 101–111)
Glucose, Bld: 84 mg/dL (ref 65–99)
POTASSIUM: 3.5 mmol/L (ref 3.5–5.1)
Sodium: 136 mmol/L (ref 135–145)
TOTAL PROTEIN: 6.4 g/dL — AB (ref 6.5–8.1)

## 2015-05-17 NOTE — Progress Notes (Signed)
[facility] PROGRESS NOTE  Molly Lee WJX:[id number] DOB: 12/18/1975 DOA: 05/10/2015 PCP: PROVIDER NOT IN SYSTEM   Subjective: Feeling better today, pain is 4/10, epigastric, wants to try regular diet.  Assessment/Plan:  Acute Pancreatitis 1. Patient states she feeling better after presenting to the ED on 05/10/2015 with 1-2 weeks of intermittent abdominal pain  that had worsened and become constant for 2 days. Stated she drinks up to a 12 pack of beer daily on the weekends.  Pain is currently 4/10.  2. - Lipase on 05/10/2015:  801       - CT of abdomen and pelvis on 05/10/2015 shows moderate to severe acute pancreatitis with surrounding peripancreatic             inflammatory changes and free fluid. No pseudocyst. No necrosis. No ductal dilatation.      - CT scan repeated after pain worsen and showed acute pancreatitis is slightly worsened with diffuse thickening of the                 pancreas with associated peripancreatic fluid and fat stranding. No peripancreatic fluid, no mass, no duct dilation.                      Decreased fluid in anterior para renal retroperitoneal spaces and  transverse mesocolon.  3. Symptom improvement. Proceed with full liquids and attempt to advance diet to solids at dinner. If well tolerated, consider  discharge.   Portal Vein Thrombosis 1. Incidental finding on CT of abdomen and pelvis.  2. IV heparin was administered then switched to Lovenox injection on 05/13/3015 3. Will change to Xarelto at discharge and continue for 3-6 months.  HTN:  Current BP 154/93; restart ACE inhibitor Hypocalcemia: Likely indicative of saponification Left flank pain: Resolved Alcohol consumption binge drinking: No apparent withdrawal signs  Bipolar affective: Continue Seroquel Hyperglycemia: Appears to have normalized  Left lower extremity asymmetric edema: No DVT on Doppler ultrasound DVT prophylaxis: On Lovenox, will continue anticoagulation therapy with  Xarelto at discharge   Code Status: Full Code. Family Communication: No family currently at bedside.  Disposition Plan: Home when stable.   Objective: Filed Vitals:   05/17/15 0609 05/17/15 0959  BP: 156/86 154/93  Pulse: 84   Temp: 98.4 F (36.9 C)   Resp: 16     Intake/Output Summary (Last 24 hours) at 05/17/15 1108 Last data filed at 05/16/15 1900  Gross per 24 hour  Intake    120 ml  Output      0 ml  Net    120 ml   Filed Weights   05/10/15 0934  Weight: 99.791 kg (220 lb)    Exam:  Gen: mild distress Cardiovascular: RRR. No M/R/G Respiratory: Lungs CTAB  Gastrointestinal: Abdomen slightly distended, tender diffusely.  Extremities: No C/E/C   Data Reviewed: Basic Metabolic Panel:  Recent Labs Lab 05/11/15 0150 05/12/15 0520 05/13/15 0534 05/14/15 0527 05/17/15 0814  NA 136 131* 134* 137 136  K 3.8 3.1* 3.6 3.7 3.5  CL 108 102 102 102 105  CO2 21* GLUCOSE 129* 83 97 89 84  BUN 7 5* <5* <5* <5*  CREATININE 0.59 0.52 0.48 0.50 0.55  CALCIUM 8.4* 8.0* 8.2* 8.5* 8.4*   Liver Function Tests:  Recent Labs Lab 05/10/15 1144 05/12/15 0520 05/13/15 0534 05/14/15 0527 05/17/15 0814  AST 62* ALT 36 ALKPHOS 48 43  45 52 46  BILITOT 1.5* 0.7 1.0 0.8 0.7  PROT 7.4 6.4* 6.3* 6.3* 6.4*  ALBUMIN 4.1 3.2* 3.1* 2.9* 2.9*    Recent Labs Lab 05/10/15 1144 05/11/15 0150 05/12/15 0520 05/14/15 0527  LIPASE 801* 601* 323* 107*   No results for input(s): AMMONIA in the last 168 hours. CBC:  Recent Labs Lab 05/10/15 1144 05/11/15 0150 05/12/15 0520 05/13/15 0534 05/14/15 0527 05/16/15 0523  WBC 11.0* 11.2* 11.9* 9.9 7.5 5.7  NEUTROABS 9.5*  --   --   --   --   --   HGB 12.6 12.1 11.1* 10.1* 9.6* 9.7*  HCT 38.0 36.4 32.9* 30.8* 29.2* 29.5*  MCV 79.5 80.5 80.8 82.1 82.3 81.5  PLT 388 341 240 245 248 240   Cardiac Enzymes: No results for input(s): CKTOTAL, CKMB, CKMBINDEX, TROPONINI in the last 168  hours. BNP (last 3 results) No results for input(s): BNP in the last 8760 hours.  ProBNP (last 3 results) No results for input(s): PROBNP in the last 8760 hours.  CBG:  Recent Labs Lab 05/16/15 0606 05/16/15 1236 05/16/15 1810 05/17/15 0018 05/17/15 0613  GLUCAP 72 101* 78 83 87    Recent Results (from the past 240 hour(s))  Urine culture     Status: None   Collection Time: 05/13/15 11:12 AM  Result Value Ref Range Status   Specimen Description URINE, RANDOM  Final   Special Requests NONE  Final   Culture   Final    MULTIPLE SPECIES PRESENT, SUGGEST RECOLLECTION [facility]    Report Status 05/14/2015 FINAL  Final     Studies: No results found.  Scheduled Meds: . enoxaparin (LOVENOX) injection  1 mg/kg Subcutaneous Q12H  . metoprolol tartrate  25 mg Oral BID  . nicotine  14 mg Transdermal QHS  . polyethylene glycol  17 g Oral Daily  . QUEtiapine  75 mg Oral QHS   Continuous Infusions: . dextrose 5 % and 0.45% NaCl 100 mL/hr at 05/16/15 [id number]    Principal Problem:   Acute alcoholic pancreatitis Active Problems:   Abdominal pain   Hypertension   Hypocalcemia   Alcohol consumption binge drinking   Portal vein thrombosis   Bipolar affective (HCC)   Hyperglycemia    Time spent: 25 minutes   Ivar DrapeAmanda Ferri  [facility] Pager [phone]. If 7PM-7AM, please contact night-coverage at [email or web address], password [email or web address] 05/17/2015, 11:08 AM  LOS: 7 days      Clint LippsMutaz A [facility]i Pager: [phone] 05/17/2015, 2:45 PM

## 2015-05-18 MED ORDER — RIVAROXABAN (XARELTO) VTE STARTER PACK (15 & 20 MG): ORAL_TABLET | ORAL | Status: AC

## 2015-05-18 MED ORDER — OXYCODONE-ACETAMINOPHEN 5-325 MG PO TABS: 1.0000 | ORAL_TABLET | Freq: Three times a day (TID) | ORAL | Status: AC | PRN

## 2015-05-18 MED FILL — XARELTO STARTER PACK: 15 & 20 | 30 days supply | Qty: 51 | Fill #0

## 2015-05-18 MED FILL — OXYCODONE/APAP 5/325MG: 5-325 | 6 days supply | Qty: 20 | Fill #0

## 2015-05-18 NOTE — Discharge Summary (Signed)
Physician Discharge Summary  Molly Lee YNW:[id number] DOB: 1975/08/01 DOA: 05/10/2015  PCP: PROVIDER NOT IN SYSTEM  Admit date: 05/10/2015 Discharge date: 05/18/2015  Time spent: 40 minutes  Recommendations for Outpatient Follow-up:  1. Follow-up with primary care physician in one week. 2. Started on Xarelto for portal vein thrombosis   Discharge Diagnoses:  Principal Problem:   Acute alcoholic pancreatitis Active Problems:   Abdominal pain   Hypertension   Hypocalcemia   Alcohol consumption binge drinking   Portal vein thrombosis   Bipolar affective (HCC)   Hyperglycemia   Discharge Condition: Stable  Diet recommendation: Heart healthy  Filed Weights   05/10/15 0934  Weight: 99.791 kg (220 lb)    History of present illness:  Molly Lee is an 40 y.o. female with a PMH of lymphedema of the lower extremities, hypertension and history of gastric bypass in 2000 and prior cholecystectomy who presents today with chief complaint of a 1-2 week history of intermittent abdominal pain that has gotten progressively worse over the past 2 days. She states that her pain has been almost constant now, but when it first started a week ago, she thought it was gas. She attempted to self treat with Tums, but had no significant relief. Her pain has increased in intensity to a 10+/10 and is described as "knifelike". She has had some associated nausea and retching, but no frank vomiting. Nothing has alleviated the pain however IV pain medicines given in the ED have eased it off some. Upon initial evaluation in the ED, her lipase was 801, and a CT of her abdomen and pelvis show moderate to severe acute pancreatitis with surrounding peripancreatic inflammatory changes and free fluid. No pseudocyst. There is also an acute partial thrombus of the main portal vein. The patient admits to binge drinking, up to a 12 pack of beer a day on the weekends. No history of alcohol withdrawal.  Hospital  Course:  Acute alcoholic pancreatitis - Imaging on 3/27 shows moderate-severe acute pancreatitis, without hemorrhage or necrosis. - Source of pancreatitis felt to be from: Binge drinking. Triglycerides 118. - Has concurrent portal vein thrombosis.  - There was no complications like Glucose intolerance, DM, splenic aneurysm or fever. - CT scan of the abdomen/pelvis repeated on 3/30 showed a slight worsening of pancreatitis without pseudocyst. - Kept nothing by mouth for prolonged period of time with aggressive IV fluid hydration. - She tolerated clear liquids and full liquids yesterday started on diet on the day of discharge and she tolerated that well so she discharged home to follow-up with PCP.   Portal vein thrombosis - Started initially on IV heparin, this is likely secondary to the acute pancreatitis. - This was switched to Lovenox while she was in the hospital, discharged on Xarelto. - This is provoked thrombosis so target anticoagulation for 3-6 months. She will need refills on her Xarelto.   Left flank pain - Has severe left flank pain developed 2 days after admission, this is worse than on admission on this side. - Differential include his pancreatitis itself, portal vein thrombosis and or other pathologies like pneumonia or pyelonephritis. - CT of the abdomen repeated and showed slight worsening of pancreatitis, UA was clear.   Hypertension - Hold Lasix and ACE inhibitor as fluid volume shifts are likely to occur as pancreatitis progresses. - Continue metoprolol as blood pressure tolerates. Home medications restarted at time of discharge.   Hypocalcemia - Monitor closely. Likely indicative saponification.   Alcohol consumption binge drinking - Patient  admits only to binge drinking on weekends, counseled extensively..   Bipolar affective (HCC) - Continue Seroquel.   Left lower extremity asymmetric edema - No DVT on Doppler  ultrasound.   Procedures:  None  Consultations:  None  Discharge Exam: Filed Vitals:   05/17/15 2038 05/18/15 0629  BP: 162/89 152/90  Pulse: 78   Temp: 98.4 F (36.9 C) 98.2 F (36.8 C)  Resp: 16 16   General: Alert and awake, oriented x3, not in any acute distress. HEENT: anicteric sclera, pupils reactive to light and accommodation, EOMI CVS: S1-S2 clear, no murmur rubs or gallops Chest: clear to auscultation bilaterally, no wheezing, rales or rhonchi Abdomen: soft nontender, nondistended, normal bowel sounds, no organomegaly Extremities: no cyanosis, clubbing or edema noted bilaterally Neuro: Cranial nerves II-XII intact, no focal neurological deficits  Discharge Instructions   Discharge Instructions    Diet - low sodium heart healthy    Complete by:  As directed      Increase activity slowly    Complete by:  As directed           Current Discharge Medication List    START taking these medications   Details  oxyCODONE-acetaminophen (ROXICET) 5-325 MG tablet Take 1 tablet by mouth every 8 (eight) hours as needed. Qty: 20 tablet, Refills: 0    Rivaroxaban (XARELTO STARTER PACK) 15 & 20 MG TBPK Take as directed on package: Start with one  tablet by mouth twice a day with food. On Day 22, switch to one  tablet once a day with food. Qty: 51 each, Refills: 0      CONTINUE these medications which have NOT CHANGED   Details  ferrous sulfate 325 (65 FE) MG tablet Take 1 tablet by mouth 2 times daily Refills: 3    furosemide (LASIX) 20 MG tablet Take 1 tablet by mouth once daily Refills: 2    lisinopril (PRINIVIL,ZESTRIL) 20 MG tablet Take 1 tablet by mouth once daily Refills: 2    metoprolol tartrate (LOPRESSOR) 25 MG tablet Take 1 tablet by mouth two times a day Refills: 3    QUEtiapine (SEROQUEL) 25 MG tablet Take 3 tablets by mouth at bedtime Refills: 1       No Known Allergies Follow-up Information    Follow up with Dr Renaldo Reel  . Go in 1 week.   Contact information:   [facility] [address], [place], [place] [address] [phone]        The results of significant diagnostics from this hospitalization (including imaging, microbiology, ancillary and laboratory) are listed below for reference.    Significant Diagnostic Studies: Ct Abdomen Pelvis W Contrast  05/14/2015  CLINICAL DATA:  Inpatient admitted with acute alcoholic pancreatitis. Portal vein thrombosis. Umbilical abdominal pain radiating to the back. Prior gastric bypass surgery and cholecystectomy. EXAM: CT ABDOMEN AND PELVIS WITH CONTRAST TECHNIQUE: Multidetector CT imaging of the abdomen and pelvis was performed using the standard protocol following bolus administration of intravenous contrast. CONTRAST:  50mL OMNIPAQUE IOHEXOL 300 MG/ML SOLN, ISOVUE-300 IOPAMIDOL (ISOVUE-300) INJECTION 61% COMPARISON:  05/10/2015 CT abdomen/pelvis. FINDINGS: Lower chest: New small layering bilateral pleural effusions with associated mild compressive atelectasis in both lower lobes. Hepatobiliary: Suggestion of diffuse hepatic steatosis. No liver mass. No definite liver surface irregularity. Cholecystectomy. No significant intrahepatic biliary ductal dilatation. Common bile duct measures 8 mm diameter, stable and within expected post cholecystectomy limits. No radiopaque choledocholithiasis. Pancreas: Diffuse thickening of the pancreas with associated peripancreatic  fluid and fat stranding, which appears worsened in the interval. No areas of pancreatic parenchymal nonenhancement or gas. No discrete peripancreatic fluid collection. No pancreatic mass or main pancreatic duct dilation. Decreased fluid in the anterior para renal retroperitoneal spaces. Decreased fluid in the transverse mesocolon. Spleen: Normal size. No mass. Adrenals/Urinary Tract: Normal adrenals. No hydronephrosis. No renal mass. Normal bladder. Stomach/Bowel: Stable expected postsurgical  changes from gastric bypass surgery, with collapsed gastric pouch, intact appearing gastrojejunostomy and collapsed excluded distal stomach. Normal caliber small bowel with no small bowel wall thickening. Normal appendix. Normal large bowel with no diverticulosis, large bowel wall thickening or pericolonic fat stranding. Oral contrast progresses to the distal rectum. Vascular/Lymphatic: Normal caliber abdominal aorta. No appreciable change in nonocclusive thrombosis of the main portal vein. Left and right portal veins, hepatic veins, splenic vein, renal veins and superior mesenteric vein appear patent. No pathologically enlarged lymph nodes in the abdomen or pelvis. Reproductive: Grossly normal uterus.  No adnexal mass. Other: Trace free fluid in the deep pelvis. No abdominal ascites. No pneumoperitoneum. Musculoskeletal: No aggressive appearing focal osseous lesions. Moderate to severe degenerative disc disease at L5-S1. IMPRESSION: 1. Persistent acute non-necrotizing pancreatitis. Inflammatory changes in the pancreas appear slightly worsened. Inflammatory fluid in the retroperitoneum and transverse mesocolon is decreased. No discrete measurable peripancreatic fluid collections. 2. Stable nonocclusive thrombosis of the main portal vein. 3. New small bilateral pleural effusions. 4. Probable diffuse hepatic steatosis. Electronically Signed   By: Delbert PhenixJason A Poff M.D.   On: 05/14/2015 00:05   Ct Abdomen Pelvis W Contrast  05/10/2015  CLINICAL DATA:  Epigastric abdominal pain with nausea. Prior gastric bypass and cholecystectomy. EXAM: CT ABDOMEN AND PELVIS WITH CONTRAST TECHNIQUE: Multidetector CT imaging of the abdomen and pelvis was performed using the standard protocol following bolus administration of intravenous contrast. CONTRAST:  100mL ISOVUE-300 IOPAMIDOL (ISOVUE-300) INJECTION 61% COMPARISON:  None. FINDINGS: Lower chest: Minor bibasilar atelectasis. No lower lobe pneumonia, collapse or consolidation. No  pericardial or pleural effusion. Hepatobiliary: Prior cholecystectomy evident. No focal hepatic abnormality or biliary dilatation. Hypodense nonocclusive filling defect within the main portal vein compatible with portal vein thrombus. The portal venous outflow, right and left portal veins within the liver still remain patent. Hepatic veins also are patent. The mesenteric and splenic veins remain patent. Pancreas: Diffuse edema and inflammatory change throughout the retroperitoneum and surrounding the pancreas. Free fluid and edema throughout the central mesentery. Small amount abdominal free fluid along both pericolic gutters and extending into the pelvis. Findings are compatible with moderate to severe acute pancreatitis. Despite this, the pancreas tissue continues to enhance without evidence necrosis. No focal fluid collection to suggest pseudocyst or abscess formation at this point. No ductal dilatation. Spleen: Within normal limits in size and appearance. Adrenals/Urinary Tract: No masses identified. No evidence of hydronephrosis. Stomach/Bowel: Prior gastric bypass evident. Mild nonspecific small bowel dilatation without significant obstruction pattern. No free air. Normal appendix demonstrated. Vascular/Lymphatic: Intact aorta. Negative for aneurysm or acute aortic abnormality. No dissection. No adenopathy demonstrated. Nonocclusive main portal vein thrombus has a blood secondary to the pancreatitis. Reproductive: Uterus and adnexa normal in size. Tubal ligation clips noted bilaterally. No fluid collection or abscess. Other: No inguinal abnormality or hernia.  Intact abdominal wall. Musculoskeletal: Diffuse degenerative changes of the spine and SI joints. No acute osseous finding. Vacuum disc phenomenon at L5-S1. IMPRESSION: Moderate to severe acute pancreatitis with surrounding peripancreatic inflammatory changes and free fluid. There is also a central mesenteric edema and abdominal free fluid along  the  pericolic gutters extending into pelvis. No fluid collection, abscess or definite pseudocyst. Acute partial thrombosis of the main portal vein as above. These results were called by telephone at the time of interpretation on 05/10/2015 at 2:06 pm to Dr. Bethann Berkshire , who verbally acknowledged these results. Electronically Signed   By: Judie Petit.  Shick M.D.   On: 05/10/2015 14:07    Microbiology: Recent Results (from the past 240 hour(s))  Urine culture     Status: None   Collection Time: 05/13/15 11:12 AM  Result Value Ref Range Status   Specimen Description URINE, RANDOM  Final   Special Requests NONE  Final   Culture   Final    MULTIPLE SPECIES PRESENT, SUGGEST RECOLLECTION Performed at [facility]    Report Status 05/14/2015 FINAL  Final     Labs: Basic Metabolic Panel:  Recent Labs Lab 05/12/15 0520 05/13/15 0534 05/14/15 0527 05/17/15 0814  NA 131* 134* 137 136  K 3.1* 3.6 3.7 3.5  CL 102 102 102 105  CO2 22 23 25 22   GLUCOSE 83 97 89 84  BUN 5* <5* <5* <5*  CREATININE 0.52 0.48 0.50 0.55  CALCIUM 8.0* 8.2* 8.5* 8.4*   Liver Function Tests:  Recent Labs Lab 05/12/15 0520 05/13/15 0534 05/14/15 0527 05/17/15 0814  AST 25 26 30 25   ALT 20 17 20 22   ALKPHOS 43 45 52 46  BILITOT 0.7 1.0 0.8 0.7  PROT 6.4* 6.3* 6.3* 6.4*  ALBUMIN 3.2* 3.1* 2.9* 2.9*    Recent Labs Lab 05/12/15 0520 05/14/15 0527  LIPASE 323* 107*   No results for input(s): AMMONIA in the last 168 hours. CBC:  Recent Labs Lab 05/12/15 0520 05/13/15 0534 05/14/15 0527 05/16/15 0523  WBC 11.9* 9.9 7.5 5.7  HGB 11.1* 10.1* 9.6* 9.7*  HCT 32.9* 30.8* 29.2* 29.5*  MCV 80.8 82.1 82.3 81.5  PLT 240 245 248 240   Cardiac Enzymes: No results for input(s): CKTOTAL, CKMB, CKMBINDEX, TROPONINI in the last 168 hours. BNP: BNP (last 3 results) No results for input(s): BNP in the last 8760 hours.  ProBNP (last 3 results) No results for input(s): PROBNP in the last 8760  hours.  CBG:  Recent Labs Lab 05/16/15 1236 05/16/15 1810 05/17/15 0018 05/17/15 0613 05/17/15 1203  GLUCAP 101* 78 83 87 78       Signed:  Jennfier Abdulla A MD.  [facility] 05/18/2015, 11:35 AM

## 2015-05-18 NOTE — Progress Notes (Signed)
PT discharged home, discharge instructions with prescriptions given. Pt verbalized understanding.  VSS.

## 2017-04-29 IMAGING — CT CT ABD-PELV W/ CM
2 of 5 series · 16 of 46 positions shown, 18 images · IV contrast (OMNIPAQUE)
Comparison: 05/10/2015 CT abdomen/pelvis.

CLINICAL DATA: Inpatient admitted with acute alcoholic
pancreatitis. Portal vein thrombosis. Umbilical abdominal pain
radiating to the back. Prior gastric bypass surgery and
cholecystectomy.

EXAM:
CT ABDOMEN AND PELVIS WITH CONTRAST
TECHNIQUE: Multidetector CT imaging of the abdomen and pelvis was performed
using the standard protocol following bolus administration of
intravenous contrast.
CONTRAST:  50mL OMNIPAQUE IOHEXOL 300 MG/ML SOLN, 100mL QYEGIV-0DD
IOPAMIDOL (QYEGIV-0DD) INJECTION 61%

[Series 2: rtn a/p with · axial · 0.82mm/px · z∈[-513,-73]mm · 13 of 98 slices shown, 15 images]
[im 5/98  soft-tissue]
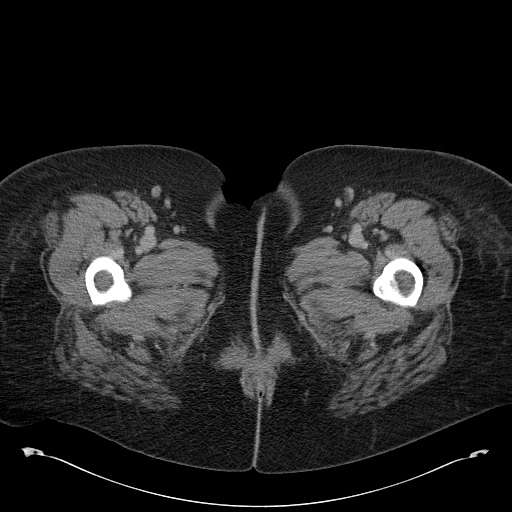
[im 5/98  bone]
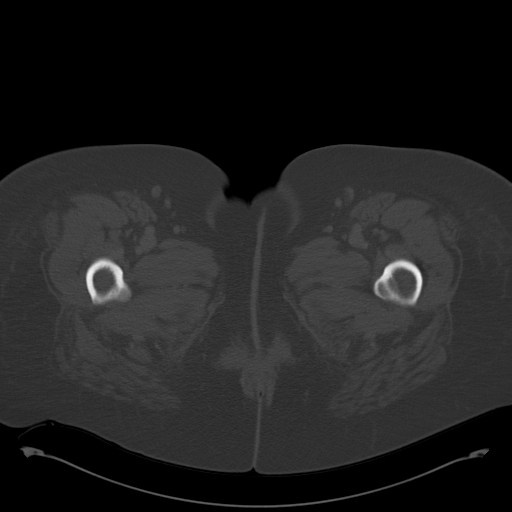
[im 15/98  soft-tissue]
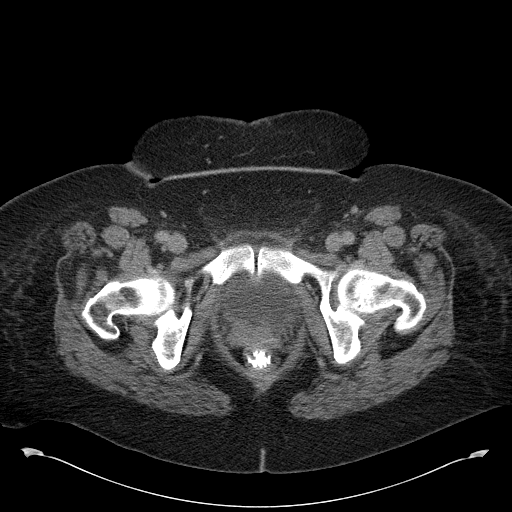
[im 20/98  soft-tissue]
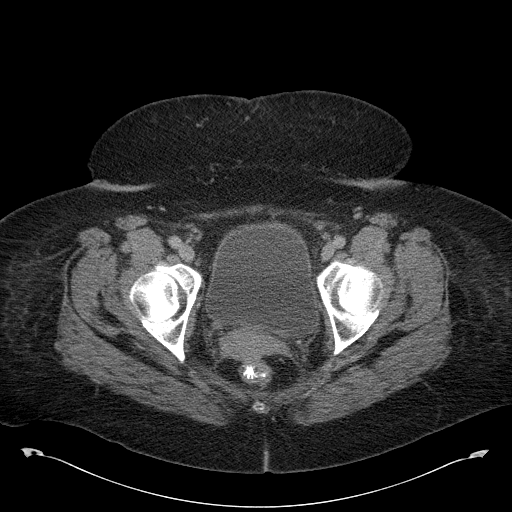
[im 30/98  soft-tissue]
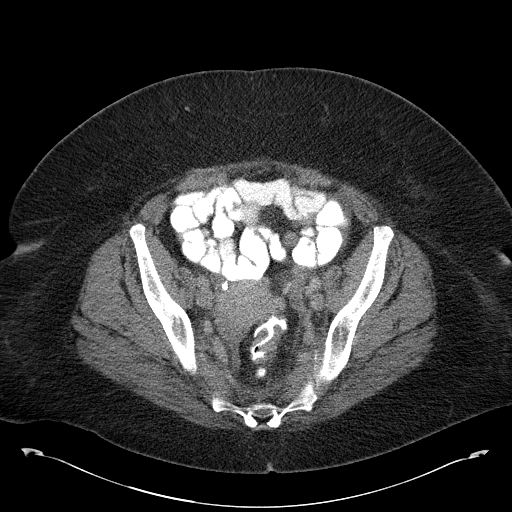
[im 34/98  soft-tissue]
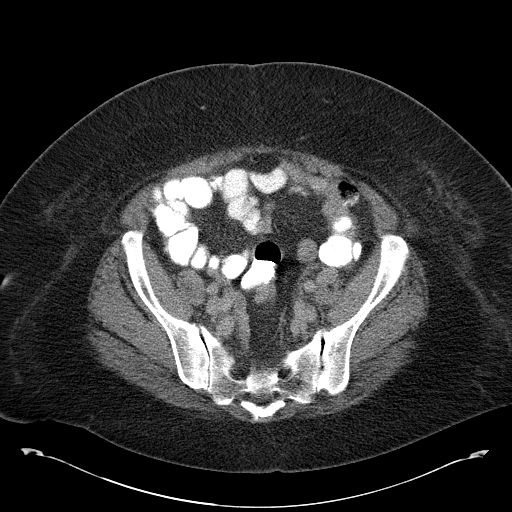
[im 44/98  soft-tissue]
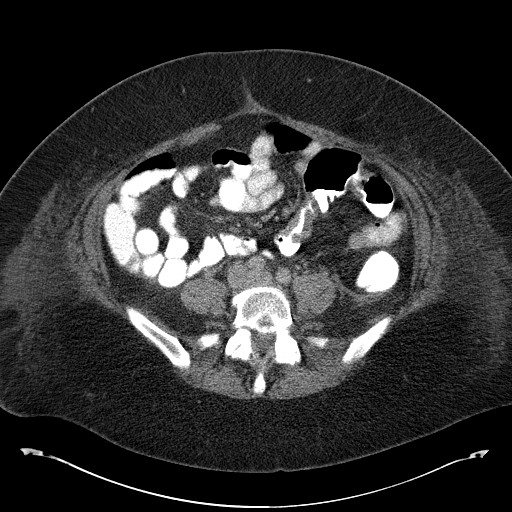
[im 49/98  soft-tissue]
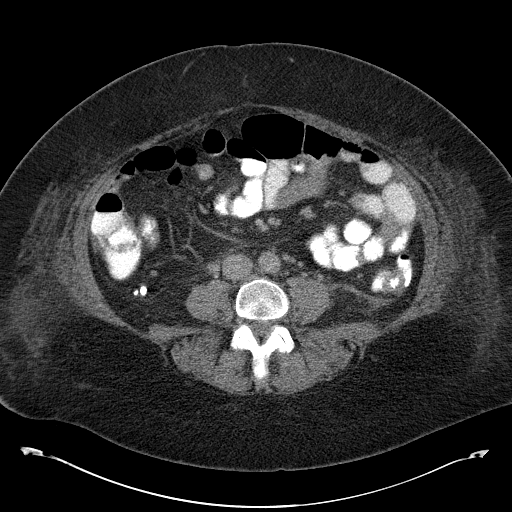
[im 54/98  soft-tissue]
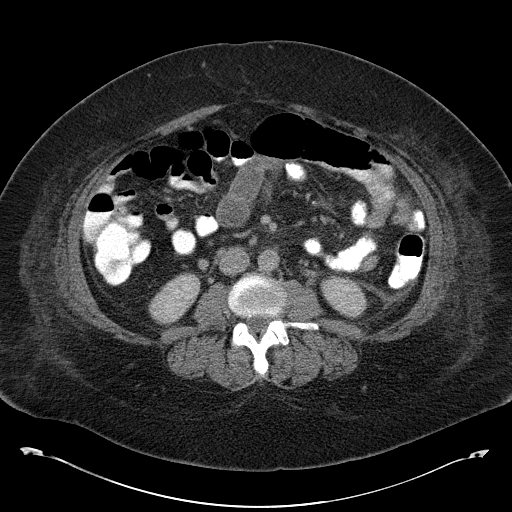
[im 64/98  soft-tissue]
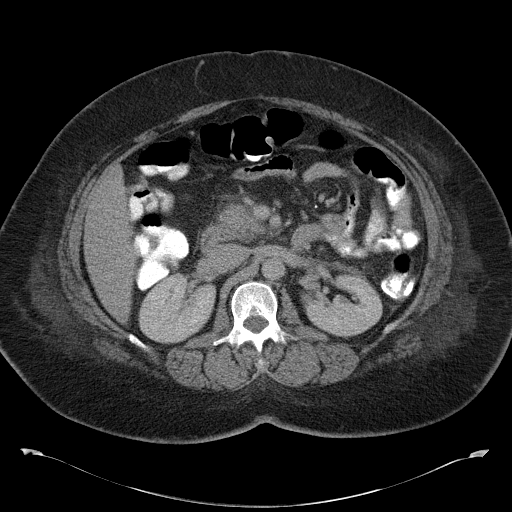
[im 64/98  bone]
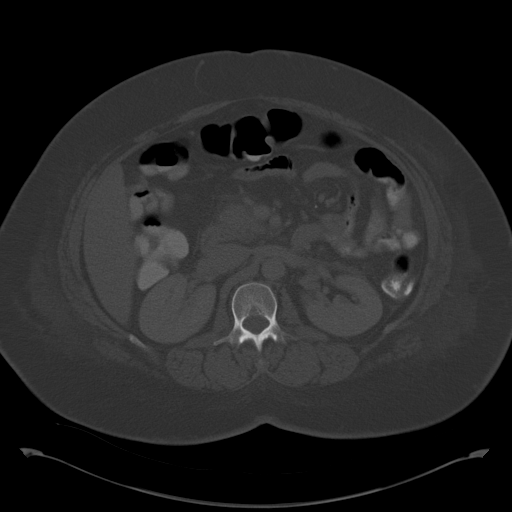
[im 68/98  soft-tissue]
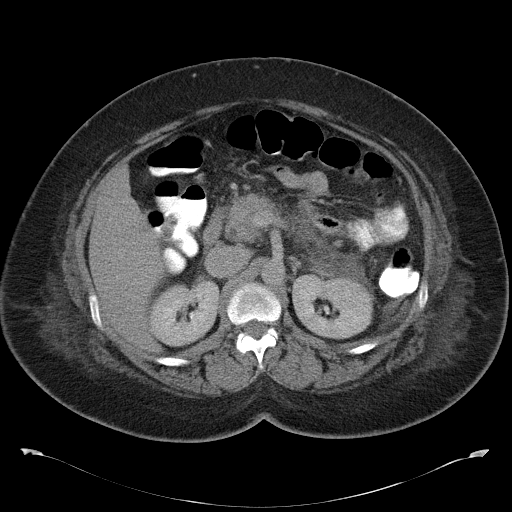
[im 78/98  soft-tissue]
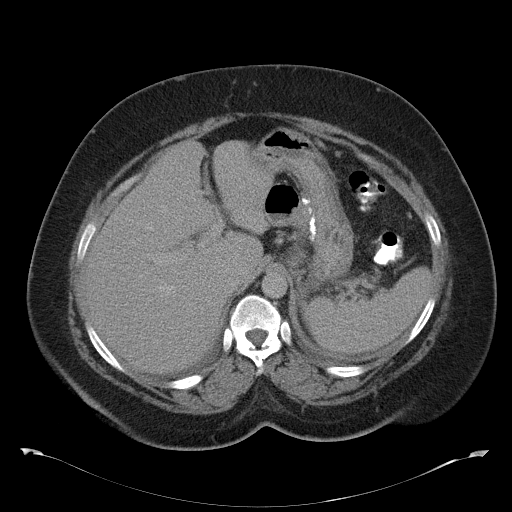
[im 83/98  soft-tissue]
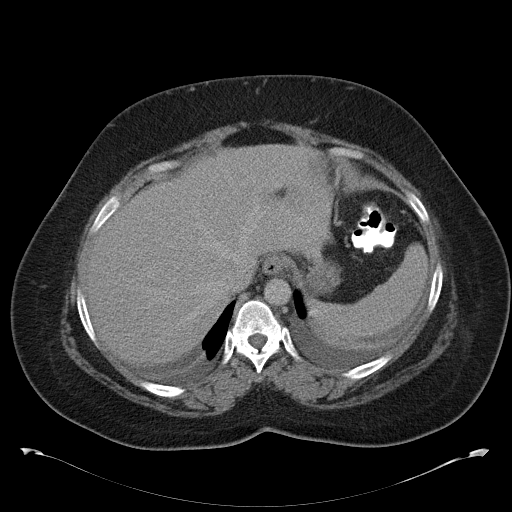
[im 93/98  soft-tissue]
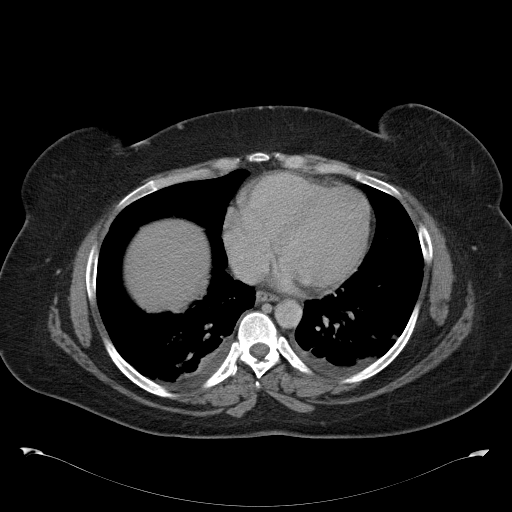

[Series 602: <mpr thick range> · coronal · 0.95mm/px · 3 of 118 slices shown]
[im 40/118  soft-tissue]
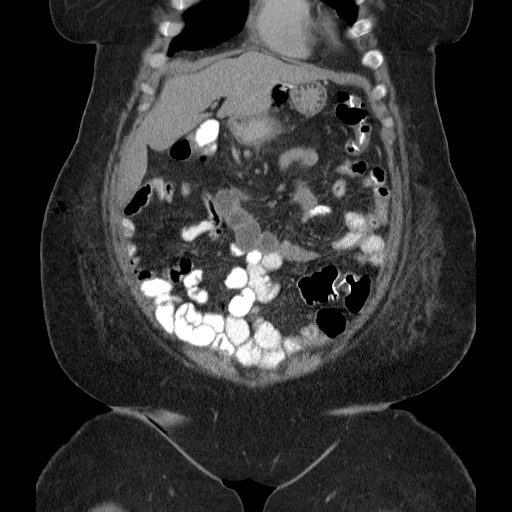
[im 53/118  soft-tissue]
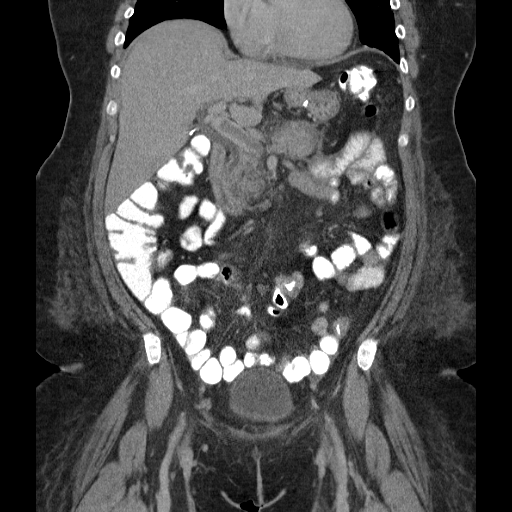
[im 66/118  soft-tissue]
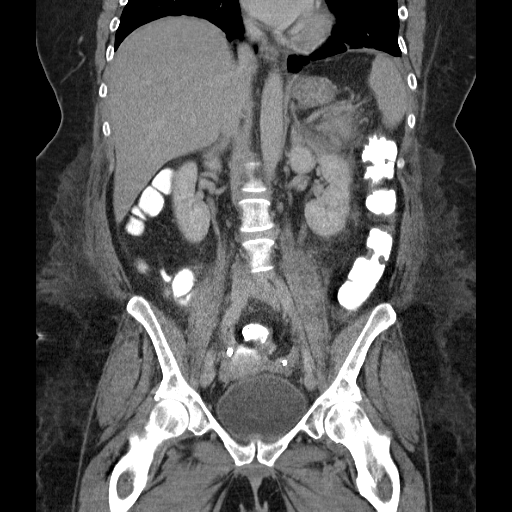

[16 of 46 positions shown; findings below may reference images not displayed]

FINDINGS: Lower chest: New small layering bilateral pleural effusions with
associated mild compressive atelectasis in both lower lobes.

Hepatobiliary: Suggestion of diffuse hepatic steatosis. No liver
mass. No definite liver surface irregularity. Cholecystectomy. No
significant intrahepatic biliary ductal dilatation. Common bile duct
measures 8 mm diameter, stable and within expected post
cholecystectomy limits. No radiopaque choledocholithiasis.

Pancreas: Diffuse thickening of the pancreas with associated
peripancreatic fluid and fat stranding, which appears worsened in
the interval. No areas of pancreatic parenchymal nonenhancement or
gas. No discrete peripancreatic fluid collection. No pancreatic mass
or main pancreatic duct dilation. Decreased fluid in the anterior
para renal retroperitoneal spaces. Decreased fluid in the transverse
mesocolon.

Spleen: Normal size. No mass.

Adrenals/Urinary Tract: Normal adrenals. No hydronephrosis. No renal
mass. Normal bladder.

Stomach/Bowel: Stable expected postsurgical changes from gastric
bypass surgery, with collapsed gastric pouch, intact appearing
gastrojejunostomy and collapsed excluded distal stomach. Normal
caliber small bowel with no small bowel wall thickening. Normal
appendix. Normal large bowel with no diverticulosis, large bowel
wall thickening or pericolonic fat stranding. Oral contrast
progresses to the distal rectum.

Vascular/Lymphatic: Normal caliber abdominal aorta. No appreciable
change in nonocclusive thrombosis of the main portal vein. Left and
right portal veins, hepatic veins, splenic vein, renal veins and
superior mesenteric vein appear patent. No pathologically enlarged
lymph nodes in the abdomen or pelvis.

Reproductive: Grossly normal uterus.  No adnexal mass.

Other: Trace free fluid in the deep pelvis. No abdominal ascites. No
pneumoperitoneum.

Musculoskeletal: No aggressive appearing focal osseous lesions.
Moderate to severe degenerative disc disease at L5-S1.
IMPRESSION: 1. Persistent acute non-necrotizing pancreatitis. Inflammatory
changes in the pancreas appear slightly worsened. Inflammatory fluid
in the retroperitoneum and transverse mesocolon is decreased. No
discrete measurable peripancreatic fluid collections.
2. Stable nonocclusive thrombosis of the main portal vein.
3. New small bilateral pleural effusions.
4. Probable diffuse hepatic steatosis.
